# Patient Record
Sex: Male | Born: 1955 | Race: White | Hispanic: No | Marital: Married | Smoking: Former smoker
Health system: Southern US, Community
[De-identification: ages and names within clinical notes are randomized; demographics above are authoritative.]

## PROBLEM LIST (undated history)

## (undated) DIAGNOSIS — G4733 Obstructive sleep apnea (adult) (pediatric): Secondary | ICD-10-CM

## (undated) DIAGNOSIS — I729 Aneurysm of unspecified site: Secondary | ICD-10-CM

## (undated) DIAGNOSIS — M109 Gout, unspecified: Secondary | ICD-10-CM

## (undated) DIAGNOSIS — I4891 Unspecified atrial fibrillation: Secondary | ICD-10-CM

## (undated) DIAGNOSIS — I10 Essential (primary) hypertension: Secondary | ICD-10-CM

## (undated) HISTORY — PX: KNEE ARTHROSCOPY: SUR90

## (undated) HISTORY — PX: TONSILLECTOMY: SUR1361

## (undated) HISTORY — DX: Essential (primary) hypertension: I10

## (undated) HISTORY — DX: Unspecified atrial fibrillation: I48.91

## (undated) HISTORY — DX: Gout, unspecified: M10.9

## (undated) HISTORY — DX: Obstructive sleep apnea (adult) (pediatric): G47.33

---
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
## 2003-12-15 ENCOUNTER — Ambulatory Visit: Payer: Self-pay | Admitting: Unknown Physician Specialty

## 2006-01-13 ENCOUNTER — Emergency Department: Payer: Self-pay

## 2006-12-06 ENCOUNTER — Ambulatory Visit: Payer: Self-pay | Admitting: Sports Medicine

## 2007-03-25 ENCOUNTER — Ambulatory Visit: Payer: Self-pay | Admitting: Orthopaedic Surgery

## 2012-04-21 ENCOUNTER — Emergency Department: Payer: Self-pay | Admitting: Emergency Medicine

## 2014-03-29 HISTORY — PX: REPLACEMENT TOTAL KNEE: SUR1224

## 2014-03-30 DIAGNOSIS — M1710 Unilateral primary osteoarthritis, unspecified knee: Secondary | ICD-10-CM | POA: Insufficient documentation

## 2014-03-30 DIAGNOSIS — M179 Osteoarthritis of knee, unspecified: Secondary | ICD-10-CM | POA: Insufficient documentation

## 2014-03-31 ENCOUNTER — Ambulatory Visit: Payer: Self-pay | Admitting: General Practice

## 2014-03-31 LAB — URINALYSIS, COMPLETE
BLOOD: NEGATIVE
Bacteria: NONE SEEN
Bilirubin,UR: NEGATIVE
Glucose,UR: NEGATIVE mg/dL (ref 0–75)
Ketone: NEGATIVE
LEUKOCYTE ESTERASE: NEGATIVE
Nitrite: NEGATIVE
PH: 5 (ref 4.5–8.0)
PROTEIN: NEGATIVE
RBC,UR: <1 /HPF (ref 0–5)
Specific Gravity: 1.021 (ref 1.003–1.030)
Squamous Epithelial: 1
WBC UR: 2 /HPF (ref 0–5)

## 2014-03-31 LAB — PROTIME-INR
INR: 0.9
Prothrombin Time: 12.7 secs

## 2014-03-31 LAB — BASIC METABOLIC PANEL
ANION GAP: 6 — AB (ref 7–16)
BUN: 14 mg/dL (ref 7–18)
CREATININE: 1.04 mg/dL (ref 0.60–1.30)
Calcium, Total: 9.4 mg/dL (ref 8.5–10.1)
Chloride: 107 mmol/L (ref 98–107)
Co2: 29 mmol/L (ref 21–32)
Glucose: 101 mg/dL — ABNORMAL HIGH (ref 65–99)
Osmolality: 284 (ref 275–301)
Potassium: 3.8 mmol/L (ref 3.5–5.1)
Sodium: 142 mmol/L (ref 136–145)

## 2014-03-31 LAB — CBC
HCT: 44.6 % (ref 40.0–52.0)
HGB: 15.1 g/dL (ref 13.0–18.0)
MCH: 32.1 pg (ref 26.0–34.0)
MCHC: 33.8 g/dL (ref 32.0–36.0)
MCV: 95 fL (ref 80–100)
PLATELETS: 177 10*3/uL (ref 150–440)
RBC: 4.71 10*6/uL (ref 4.40–5.90)
RDW: 12.8 % (ref 11.5–14.5)
WBC: 5.8 10*3/uL (ref 3.8–10.6)

## 2014-03-31 LAB — APTT: ACTIVATED PTT: 26.8 s (ref 23.6–35.9)

## 2014-03-31 LAB — MRSA PCR SCREENING

## 2014-03-31 LAB — SEDIMENTATION RATE: Erythrocyte Sed Rate: 6 mm/hr (ref 0–20)

## 2014-04-01 LAB — URINE CULTURE

## 2014-04-07 ENCOUNTER — Ambulatory Visit: Payer: Self-pay | Admitting: Family Medicine

## 2014-04-12 ENCOUNTER — Inpatient Hospital Stay: Payer: Self-pay | Admitting: General Practice

## 2014-04-27 DIAGNOSIS — Z96659 Presence of unspecified artificial knee joint: Secondary | ICD-10-CM | POA: Insufficient documentation

## 2014-06-27 NOTE — Discharge Summary (Signed)
PATIENT NAME:  Tony Hampton, Tony Hampton MR#:  [id number] DATE OF BIRTH:  [birth year]  DATE OF ADMISSION:  04/12/2014 DATE OF DISCHARGE:  04/15/2014  ADMITTING DIAGNOSIS: Degenerative arthrosis of the right knee.   DISCHARGE DIAGNOSIS: Degenerative arthrosis of the right knee.   HISTORY: The patient is a 59 year old gentleman who has been followed at [facility] for progression of right knee pain. The patient had previously underwent a right knee arthroscopy for partial medial meniscectomy as well as chondroplasty in 2009. He did well for a number of years, but has had progressive medial and anterior right knee pain. His symptoms have been worse since he retired. He was having difficulty ascending and descending stairs. He had reported some activity-related swelling as well as near giving way of the knee. He denied any gross locking of the knee. At the time of surgery, he was not using any ambulatory aid. The patient states his right knee pain had progressed to the point that it was significantly interfering with his activities of daily living. X-rays taken at [facility] showed narrowing of the medial cartilage space with associated varus alignment. There was osteophyte as well as subchondral sclerosis noted. After discussion of the risks and benefits of surgical intervention, the patient expressed his understanding of the risks and benefits and agreed for plans for surgical intervention.   HOSPITAL COURSE:   PROCEDURE: Right total knee arthroplasty using computer-assisted navigation.   ANESTHESIA: Spinal.   SOFT TISSUE RELEASE: Anterior cruciate ligament, posterior cruciate ligament, deep and superficial medial collateral ligaments, as well as patellofemoral ligament.   IMPLANTS UTILIZED:  DePuy PFC Sigma size 5 posterior stabilized femoral component (cemented), size 5 MBT tibial component (cemented), 41 mm 3-pegged oval dome patella (cemented), and a 10 mm stabilized rotating platform  polyethylene insert.   The patient tolerated the procedure very well. He had no complications. He was then taken to the PAC-U where he was stabilized and then transferred to the orthopedic floor. The patient began receiving anticoagulation therapy of Lovenox 40 mg subcutaneous every 12 hours per anesthesia and pharmacy protocol. He was fitted with TED stockings bilaterally. These were allowed to be removed 1 hour per 8 hour shift. The right one was applied on day 2 following removal of the Hemovac. The patient was also fitted with the AV-I compression foot pumps bilaterally set at 80 mmHg. His calves have been nontender. There has been no evidence of any DVTs. Negative Homans sign. Heels were elevated off the bed using rolled towels.   The patient has denied any chest pain or shortness of breath. Vital signs have been stable. He has been afebrile. Hemodynamically he is stable. No transfusions were given other than the Autovac transfusion given the first 6 hours postoperatively.   Physical therapy was initiated on day 1 for gait training and transfers. Upon being discharged, was ambulating greater than 200 feet. He was able go up 4 steps and down. He was independent with bed to chair transfers. Occupational therapy was also initiated on day 1 for activities of daily living and assistive devices.   The patient is being discharged to his home in improved stable condition.   DISCHARGE INSTRUCTIONS: He can continue full weight-bearing as tolerated. Continue using a walker until cleared by physical therapy to go to a quad cane. He will receive home health/PT.  He was instructed in elevation of the lower extremity. Continue TED stockings. These are to be worn bilaterally and may be removed at night but are  to be worn during the day. Incentive spirometer q. 1 hour while awake. Encourage cough, deep breathing q. 2 hours while awake. He is placed on a regular diet. He is to continue with Polar Care to the surgical  leg, maintaining a temperature of 40-50 degrees Fahrenheit. He was instructed on wound care. He has a follow-up appointment on March 1st at 8:45. He is to call us sooner if any temperatures of 101.5 or greater or excessive bleeding.   DISCHARGE MEDICATIONS: The patient may resume his regular medications that he was on prior to admission. He was given a prescription for Lovenox 40 mg subcutaneously daily for 14 days, then discontinue and begin taking 81 mg enteric-coated aspirin. Also a prescription for Roxicodone 5-10 mg every 4-6 hours p.r.n. for pain and Tramadol 50-100 mg q. 4-6 hours p.r.n. for pain.   PAST MEDICAL HISTORY: Atrial fibrillation, asthma, gout, hemorrhoids, and hypertension.   ___________________________ Vance Peper, PA jrw:mc D: 04/15/2014 07:47:04 ET T: 04/15/2014 08:08:00 ET JOB#: [id number]  cc: Vance Peper, PA, <Dictator> JON WOLFE PA ELECTRONICALLY SIGNED 04/18/2014 19:49

## 2014-06-27 NOTE — Discharge Summary (Signed)
PATIENT NAME:  Tony Hampton, CIMO MR#:  [id number] DATE OF BIRTH:  02/24/1956  DATE OF ADMISSION:  04/12/2014 DATE OF DISCHARGE:    ADDENDUM:   The patient was initially to be discharged on 04/15/2014. The patient was held based on the fact that he could not ambulate because of bilateral toe pain. The patient has a history of gout and was given colchicine for his Gout flare up. The patient did well although he was having some difficulty the day of initial discharge and then on 04/16/2014 was able to ambulate around the nurse's station, as well as to do stairs. Instead of going to rehab was ready to go home with home health physical therapy and good family support.    ____________________________ Lenna Sciara. Reche Dixon, [place] jtm:at D: 04/16/2014 06:17:47 ET T: 04/16/2014 09:55:19 ET JOB#: [id number]  cc: J. Reche Dixon, [place], <Dictator> J Jese Comella [facility] PA ELECTRONICALLY SIGNED 04/18/2014 7:11

## 2014-06-27 NOTE — Op Note (Signed)
PATIENT NAME:  Tony Hampton, Tony Hampton MR#:  [id number] DATE OF BIRTH:  29-Aug-1955  DATE OF PROCEDURE:  04/12/2014  PREOPERATIVE DIAGNOSIS: Degenerative arthrosis of right knee (primary).   POSTOPERATIVE DIAGNOSIS: Degenerative arthrosis of the right knee (primary).   PROCEDURE PERFORMED: Right total knee arthroplasty using computer-assisted navigation.   SURGEON: Skip Estimable, M.D.   ASSISTANT: Vance Peper, PA (required to maintain retraction throughout the procedure).   ANESTHESIA: Spinal.   ESTIMATED BLOOD LOSS: 100 mL.   FLUIDS REPLACED: 1300 mL of crystalloid.   TOURNIQUET TIME: 90 minutes.    DRAINS: Two medium drains to reinfusion system.   SOFT TISSUE RELEASES: Anterior cruciate ligament, posterior cruciate ligament, deep and superficial medial collateral ligament and patellofemoral ligament.   IMPLANTS UTILIZED:   DePuy PFC Sigma size 5 posterior stabilized femoral component (cemented), size 5 MBT tibial component (cemented), 41 mm 3 peg oval dome patella (cemented), and a 10 mm stabilized rotating platform polyethylene insert.   INDICATIONS FOR SURGERY:  The patient is a 59 year old male who has been seen for complaints of progressive right knee pain.  X-rays demonstrated severe degenerative changes in tri-compartmental fashion with relative varus deformity. After discussion of the risks and benefits of surgical intervention, the patient expressed understanding of the risks and benefits, and agreed with plans for surgical intervention.   PROCEDURE IN DETAIL: The patient was brought to the Operating Room, and after adequate spinal anesthesia was achieved, a tourniquet was placed on the patient's upper right thigh. The patient's right knee and leg were cleaned and prepped with alcohol and DuraPrep and draped in the usual sterile fashion. A "timeout" was performed as per usual protocol. The right lower extremity was exsanguinated using an Esmarch, a tourniquet was inflated to 300 mmHg.  An  anterior longitudinal incision was made followed by a standard mid vastus approach.  A small effusion was evacuated. The deep fibers of the medial collateral ligament were elevated in a subperiosteal fashion off the medial flare of the tibia so as to maintain a continuous soft tissue sleeve. The patella was subluxed laterally and the patellofemoral ligament was incised. Inspection of the knee demonstrated severe degenerative changes with full-thickness loss of articular cartilage to the medial compartment.  Prominent osteophytes were debrided using a rongeur. Anterior and posterior cruciate ligaments were excised. Two 4.0 mm Schanz pins were inserted into the femur and into the tibia for attachment of the array of trackers used for computer-assisted navigation.  Hip center was identified using circumduction technique.  Distal landmarks were mapped using the computer. The distal femur and proximal tibia were mapped using the computer. Distal femoral cutting guide was positioned using computer-assisted navigation so as to achieve a 5 degree distal valgus cut. Cut was performed and verified using the computer.  Distal femur was sized and it was felt that a size 5 femoral component was appropriate. A size five cutting guide was positioned and anterior cut was performed, verified by using the computer.  This was followed by completion of the posterior and chamfer cuts.  Femoral cutting guide for the central box was then positioned, central box cut was performed. Attention was then directed to the proximal tibia. Medial and lateral menisci were excised. The extramedullary tibial cutting guide was positioned using computer-assisted navigation so as to achieve a 0 degrees varus valgus alignment and 0 degrees posterior slope.  Cut was performed and verified using the computer. The proximal tibia was sized and it was felt that a size 5 tibial tray  was appropriate. Tibial and femoral trials were inserted followed by insertion  of a 10 mm polyethylene insert. The knee was felt to be tight medially.  A Cobb elevator was used to elevate the superficial fibers of the medial collateral ligament.  This allowed for excellent mediolateral soft tissue balancing both with full extension and in flexion.  Finally, the patella was cut and prepared so as to accommodate a 41 mm 3-peg oval dome patella. Patellar trial was placed and the knee was placed through a range of motion with excellent patellar tracking appreciated. The femoral trial was removed after debridement of posterior osteophytes. Central post hole for the tibial component was reamed followed by insertion of a keel punch. Tibial trial was then removed. The cut surfaces of bone were irrigated with copious amounts of normal saline with antibiotic solution using pulsatile lavage then suctioned dry. Polymethyl methacrylate cement was prepared in the usual fashion using a vacuum mixer.  Cement was applied to the cut surface of the proximal tibia as well as along the undersurface of a size 5 MBT tibial component. Tibial component was positioned and impacted into place. Excess cement was removed using freer elevators. Cement was then applied to the cut surface of the femur as well as along the posterior flanges of a size 5 posterior stabilized femoral component. Femoral component was positioned and impacted into place. Excess cement was removed using freer elevators. A 10 mm polyethylene trial was inserted and the knee was brought in full extension with steady axial compression applied. Finally, cement was applied to the backside of a 41 mm 3-peg oval dome patella, and patellar component was positioned and patellar clamp applied.  Excess cement was removed using freer elevators.   After adequate curing of cement, the tourniquet was deflated after total tourniquet time of 90 minutes.  Hemostasis was achieved using electrocautery.  The knee was irrigated with copious amounts of normal saline with  antibiotic solution using pulsatile lavage and then suctioned dry. The knee was inspected for any residual cement debris.  20 mL of 1.3% Exparel and 40 mL of normal saline was injected along the posterior capsule, medial and lateral gutters, and along the arthrotomy site. A 10 mm stabilized rotating platform polyethylene insert was inserted and the knee was placed through a range of motion with excellent mediolateral soft tissue balancing and excellent patellar tracking noted. Two medium drains were placed in the wound bed and brought out through a separate stab incision to be attached to a reinfusion system. The medial parapatellar portion of the incision was reapproximated using interrupted sutures of #1 Vicryl.  The subcutaneous tissue was approximated in layers using first #0 Vicryl followed by 2-0 Vicryl. Then, 30 mL of 0.25% Marcaine with epinephrine was injected into the subcutaneous tissue in line with the skin incision.  Skin was closed with skin staples.  A sterile dressing was applied. The patient tolerated the procedure well. He was transported to the recovery room in stable condition.      ____________________________ Laurice Record. Holley Bouche., MD jph:DT D: 04/12/2014 11:02:58 ET T: 04/12/2014 14:08:33 ET JOB#: [id number]  cc: Laurice Record. Holley Bouche., MD, <Dictator> Katherine P Holley Bouche MD ELECTRONICALLY SIGNED 04/12/2014 20:21

## 2014-08-12 ENCOUNTER — Other Ambulatory Visit: Payer: Self-pay | Admitting: Family Medicine

## 2014-12-13 ENCOUNTER — Other Ambulatory Visit: Payer: Self-pay

## 2014-12-13 DIAGNOSIS — E669 Obesity, unspecified: Secondary | ICD-10-CM | POA: Insufficient documentation

## 2014-12-13 DIAGNOSIS — G44019 Episodic cluster headache, not intractable: Secondary | ICD-10-CM | POA: Insufficient documentation

## 2014-12-13 DIAGNOSIS — I4891 Unspecified atrial fibrillation: Secondary | ICD-10-CM | POA: Insufficient documentation

## 2014-12-13 DIAGNOSIS — Z8269 Family history of other diseases of the musculoskeletal system and connective tissue: Secondary | ICD-10-CM | POA: Insufficient documentation

## 2014-12-13 DIAGNOSIS — I10 Essential (primary) hypertension: Secondary | ICD-10-CM | POA: Insufficient documentation

## 2014-12-14 ENCOUNTER — Ambulatory Visit (INDEPENDENT_AMBULATORY_CARE_PROVIDER_SITE_OTHER): Payer: BLUE CROSS/BLUE SHIELD | Admitting: Family Medicine

## 2014-12-14 ENCOUNTER — Encounter: Payer: Self-pay | Admitting: Family Medicine

## 2014-12-14 VITALS — BP 132/98 | HR 79 | Temp 97.9°F | Resp 16 | Wt 340.4 lb

## 2014-12-14 DIAGNOSIS — J069 Acute upper respiratory infection, unspecified: Secondary | ICD-10-CM | POA: Diagnosis not present

## 2014-12-14 DIAGNOSIS — R350 Frequency of micturition: Secondary | ICD-10-CM | POA: Diagnosis not present

## 2014-12-14 LAB — POCT URINALYSIS DIPSTICK
Bilirubin, UA: NEGATIVE
Blood, UA: NEGATIVE
GLUCOSE UA: NEGATIVE
Ketones, UA: NEGATIVE
LEUKOCYTES UA: NEGATIVE
Nitrite, UA: NEGATIVE
Protein, UA: NEGATIVE
UROBILINOGEN UA: 0.2
pH, UA: 6

## 2014-12-14 MED ORDER — ALBUTEROL SULFATE HFA 108 (90 BASE) MCG/ACT IN AERS: 2.0000 | INHALATION_SPRAY | Freq: Four times a day (QID) | RESPIRATORY_TRACT | Status: DC | PRN

## 2014-12-14 MED ORDER — PROMETHAZINE-DM 6.25-15 MG/5ML PO SYRP: 5.0000 mL | ORAL_SOLUTION | Freq: Four times a day (QID) | ORAL | Status: DC | PRN

## 2014-12-14 NOTE — Progress Notes (Signed)
Patient ID: KEYONTE COOKSTON, male   DOB: 01-04-1956, 59 y.o.   MRN: [id number] Name: DEVARIS QUIRK   MRN: [id number]    DOB: [birth year]   Date:12/14/2014       Progress Note  Subjective  Chief Complaint  Chief Complaint  Patient presents with  . URI    URI  This is a new problem. The current episode started in the past 7 days. The problem has been gradually worsening. There has been no fever. Associated symptoms include congestion and coughing. Pertinent negatives include no ear pain, headaches, joint pain, rhinorrhea, sinus pain, sneezing or sore throat. He has tried antihistamine for the symptoms.   Urinary Frequency Onset over the past year. No hematuria or dysuria. Get up 3 times at night on average and disturbs sleep with urgency. Also, having decrease in erectile strength and decrease in urine stream strength.  Patient Active Problem List   Diagnosis Date Noted  . Atrial fibrillation ([place]) 12/13/2014  . Cluster headache, episodic 12/13/2014  . Family history of gout 12/13/2014  . Essential (primary) hypertension 12/13/2014  . Obesity 12/13/2014  . H/O total knee replacement 04/27/2014  . Arthritis of knee, degenerative 03/30/2014  . Compulsive tobacco user syndrome 02/27/1968   No past surgical history on file.   Family History  Problem Relation Age of Onset  . Asthma Mother   . Hypertension Mother   . Hypertension Father   . Diabetes Father   . Breast cancer Sister    Social History  Substance Use Topics  . Smoking status: Never Smoker   . Smokeless tobacco: Never Used  . Alcohol Use: No    Current outpatient prescriptions:  .  amLODipine (NORVASC) 5 MG tablet, Take by mouth., Disp: , Rfl:  .  Aspirin 81 MG EC tablet, Take by mouth., Disp: , Rfl:  .  Colchicine 0.6 MG CAPS, Take by mouth., Disp: , Rfl:  .  metoprolol succinate (TOPROL-XL) 50 MG 24 hr tablet, TAKE 1 AND 1/2 TABLET BY MOUTH EVERY DAY, Disp: 135 tablet, Rfl: 1  No Known Allergies  Review of  Systems  Constitutional: Negative.   HENT: Positive for congestion. Negative for ear pain, rhinorrhea, sneezing and sore throat.   Eyes: Negative.   Respiratory: Positive for cough and shortness of breath.   Cardiovascular: Negative.   Gastrointestinal: Negative.   Genitourinary: Negative.   Musculoskeletal: Negative.  Negative for joint pain.  Skin: Negative.   Neurological: Negative.  Negative for headaches.  Endo/Heme/Allergies: Negative.   Psychiatric/Behavioral: Negative.    Objective  Filed Vitals:   12/14/14 0808  BP: 132/98  Pulse: 79  Temp: 97.9 F (36.6 C)  TempSrc: Oral  Resp: 16  Weight: 340 lb 6.4 oz (154.404 kg)  SpO2: 94%   Physical Exam  Constitutional: He is oriented to person, place, and time and well-developed, well-nourished, and in no distress.  Morbid obesity  HENT:  Head: Normocephalic and atraumatic.  Right Ear: External ear normal.  Left Ear: External ear normal.  Nose: Nose normal.  Mouth/Throat: Oropharynx is clear and moist.  Eyes: Conjunctivae and EOM are normal.  Neck: Normal range of motion. Neck supple.  Cardiovascular: Normal rate, regular rhythm and normal heart sounds.   Pulmonary/Chest: Effort normal and breath sounds normal.  Abdominal: Soft. Bowel sounds are normal.  Musculoskeletal: Normal range of motion.  Lymphadenopathy:    He has no cervical adenopathy.  Neurological: He is alert and oriented to person, place, and time.  Psychiatric: Affect  and judgment normal.   Assessment & Plan  1. Upper respiratory infection Onset over the past week with cough worse with wheezing at night. No fever, rales or rhonchi. Will treat with cough syrup and albuterol inhaler. Recheck if any worsening or fever in the next week. - promethazine-dextromethorphan (PROMETHAZINE-DM) 6.25-15 MG/5ML syrup; Take 5 mLs by mouth 4 (four) times daily as needed for cough.  Dispense: 118 mL; Refill: 0 - albuterol (PROVENTIL HFA;VENTOLIN HFA) 108 (90 BASE)  MCG/ACT inhaler; Inhale 2 puffs into the lungs every 6 (six) hours as needed for wheezing or shortness of breath.  Dispense: 1 Inhaler; Refill: 0  2. Frequent urination Onset over the past year. No hematuria or dysuria. Urinalysis normal. Suspect BPH with nocturia and decreased stream. Recommend scheduling for CPE and blood tests. No sign of infection today. Family history positive for diabetes. No polydipsia or blurring of vision. - POCT urinalysis dipstick

## 2014-12-21 ENCOUNTER — Ambulatory Visit (INDEPENDENT_AMBULATORY_CARE_PROVIDER_SITE_OTHER): Payer: BLUE CROSS/BLUE SHIELD | Admitting: Family Medicine

## 2014-12-21 ENCOUNTER — Encounter: Payer: Self-pay | Admitting: Family Medicine

## 2014-12-21 VITALS — BP 130/88 | HR 71 | Temp 97.9°F | Resp 18 | Ht 75.0 in | Wt 341.0 lb

## 2014-12-21 DIAGNOSIS — Z96651 Presence of right artificial knee joint: Secondary | ICD-10-CM | POA: Diagnosis not present

## 2014-12-21 DIAGNOSIS — I4891 Unspecified atrial fibrillation: Secondary | ICD-10-CM | POA: Diagnosis not present

## 2014-12-21 DIAGNOSIS — R351 Nocturia: Secondary | ICD-10-CM

## 2014-12-21 DIAGNOSIS — Z23 Encounter for immunization: Secondary | ICD-10-CM

## 2014-12-21 DIAGNOSIS — Z0000 Encounter for general adult medical examination without abnormal findings: Secondary | ICD-10-CM

## 2014-12-21 DIAGNOSIS — I10 Essential (primary) hypertension: Secondary | ICD-10-CM | POA: Diagnosis not present

## 2014-12-21 DIAGNOSIS — E669 Obesity, unspecified: Secondary | ICD-10-CM

## 2014-12-21 NOTE — Progress Notes (Signed)
Patient ID: Tony Hampton, male   DOB: 1956/01/12, 59 y.o.   MRN: [id number]       Patient: Tony Hampton, Male    DOB: September 24, 1955, 59 y.o.   MRN: [id number] Visit Date: 12/21/2014  Today's Provider: Vernie Murders, PA   Chief Complaint  Patient presents with  . Annual Exam   Subjective:    Annual physical exam Tony Hampton is a 59 y.o. male who presents today for health maintenance and complete physical. He feels well. He reports exercising none. He reports he is sleeping well (average 6-7 hours a night).  -----------------------------------------------------------------   Review of Systems  Constitutional: Negative.   HENT: Negative.   Eyes: Negative.   Respiratory: Positive for shortness of breath.   Cardiovascular: Negative.   Gastrointestinal: Negative.   Endocrine: Negative.   Genitourinary: Negative.        Nocturia once a night with some dribbling after he thought he was finished urinating.  Musculoskeletal: Negative.   Skin: Negative.   Allergic/Immunologic: Negative.   Neurological: Negative.   Hematological: Negative.   Psychiatric/Behavioral: Negative.     Social History He  reports that he has quit smoking. He has never used smokeless tobacco. He reports that he drinks alcohol. He reports that he does not use illicit drugs. Social History   Social History  . Marital Status: Married    Spouse Name: N/A  . Number of Children: N/A  . Years of Education: N/A   Social History Main Topics  . Smoking status: Former Research scientist (life sciences)  . Smokeless tobacco: Never Used  . Alcohol Use: 0.0 oz/week    0 Standard drinks or equivalent per week     Comment: OCCASIONALLY  . Drug Use: No  . Sexual Activity: Not Asked   Other Topics Concern  . None   Social History Narrative    Patient Active Problem List   Diagnosis Date Noted  . Atrial fibrillation ([place]) 12/13/2014  . Cluster headache, episodic 12/13/2014  . Family history of gout 12/13/2014  . Essential  (primary) hypertension 12/13/2014  . Obesity 12/13/2014  . H/O total knee replacement 04/27/2014  . Arthritis of knee, degenerative 03/30/2014  . Compulsive tobacco user syndrome 02/27/1968    Past Surgical History  Procedure Laterality Date  . Tonsillectomy    . Knee arthroscopy Right     Family History  No family status information on file.   His family history includes Asthma in his mother; Breast cancer in his sister; Diabetes in his father; Hypertension in his father and mother.    No Known Allergies  Previous Medications   ALBUTEROL (PROVENTIL HFA;VENTOLIN HFA) 108 (90 BASE) MCG/ACT INHALER    Inhale 2 puffs into the lungs every 6 (six) hours as needed for wheezing or shortness of breath.   AMLODIPINE (NORVASC) 5 MG TABLET    Take by mouth.   ASPIRIN 81 MG EC TABLET    Take by mouth.   COLCHICINE 0.6 MG CAPS    Take by mouth.   METOPROLOL SUCCINATE (TOPROL-XL) 50 MG 24 HR TABLET    TAKE 1 AND 1/2 TABLET BY MOUTH EVERY DAY   PROMETHAZINE-DEXTROMETHORPHAN (PROMETHAZINE-DM) 6.25-15 MG/5ML SYRUP    Take 5 mLs by mouth 4 (four) times daily as needed for cough.    Patient Care Team: Jerrol Banana., MD as PCP - General (Family Medicine)     Objective:   Vitals: BP 130/88 mmHg  Pulse 71  Temp(Src) 97.9 F (36.6 C) (Oral)  Resp 18  Ht 6\' 3"  (1.905 m)  Wt 341 lb (154.677 kg)  BMI 42.62 kg/m2  SpO2 97%   Physical Exam  Constitutional: He is oriented to person, place, and time. He appears well-developed and well-nourished.  Morbid obesity  HENT:  Head: Normocephalic and atraumatic.  Right Ear: External ear normal.  Left Ear: External ear normal.  Nose: Nose normal.  Mouth/Throat: Oropharynx is clear and moist.  Eyes: Conjunctivae and EOM are normal. Pupils are equal, round, and reactive to light. Right eye exhibits no discharge.  Neck: Normal range of motion. Neck supple. No tracheal deviation present. No thyromegaly present.  Cardiovascular: Normal rate,  regular rhythm, normal heart sounds and intact distal pulses.   No murmur heard. Pulmonary/Chest: Effort normal and breath sounds normal. No respiratory distress. He has no wheezes. He has no rales. He exhibits no tenderness.  Abdominal: Soft. He exhibits no distension and no mass. There is no tenderness. There is no rebound and no guarding.  Musculoskeletal: Normal range of motion. He exhibits no edema or tenderness.  Few superficial varicose veins in right calf with history of right knee replacement.  Lymphadenopathy:    He has no cervical adenopathy.  Neurological: He is alert and oriented to person, place, and time. He has normal reflexes. No cranial nerve deficit. He exhibits normal muscle tone. Coordination normal.  Skin: Skin is warm and dry. No rash noted. No erythema.  Psychiatric: He has a normal mood and affect. His behavior is normal. Judgment and thought content normal.   Depression Screen No suicidal ideation. Sleeping well without mood swings. No anxiety or panic attacks.  Assessment & Plan:     Routine Health Maintenance and Physical Exam  Exercise Activities and Dietary recommendations Goals    Encouraged to work on weight loss with low fat diet and reduced calorie intake. Should exercise 20-30 minutes 3-4 times a week as joint replacement allows.      There is no immunization history on file for this patient.  Health Maintenance  Topic Date Due  . Hepatitis C Screening  06-05-1955  . HIV Screening  12/28/1971  . TETANUS/TDAP  12/28/1975  . COLONOSCOPY  12/28/2006  . INFLUENZA VACCINE  09/27/2014   Discussed health benefits of physical activity, and encouraged him to engage in regular exercise appropriate for his age and condition.    --------------------------------------------------------------------  1. Annual physical exam Stable general health. Unsure when he had his last Tdap and declines other vaccinations today. Last colonoscopy approximately in 2009  per patient.  2. Atrial fibrillation, unspecified type ([place]) Asymptomatic. History of A. Fib with PVC's controlled by Metoprolol. EKG today raises the question of lateral infarct of undetermined age. Recommend he follow up with Dr. Saralyn Pilar. Patient agrees to schedule his annual recheck soon. - EKG 12-Lead  3. Essential (primary) hypertension Stable and well controlled with Amlodipine and Metoprolol. Will get routine labs and follow up pending reports. - TSH - Lipid panel  4. Obesity BMI over 42. Will get routine labs and encouraged to lower caloric intake and exercise as the knee replacement allows. - Comprehensive metabolic panel - CBC with Differential/Platelet  5. H/O total knee replacement, right Joint replaced February 2016 by Dr. Marry Guan. Well healed and feeling improved.  6. Nocturia Only nocturia 1-2 times a night. No hesitancy, decreased stream or dribbling. - PSA  7. Need for Tdap vaccination - Tdap vaccine greater than or equal to 7yo IM

## 2014-12-21 NOTE — Patient Instructions (Signed)

## 2014-12-25 ENCOUNTER — Other Ambulatory Visit: Payer: Self-pay | Admitting: Family Medicine

## 2015-01-19 ENCOUNTER — Encounter: Payer: Self-pay | Admitting: Family Medicine

## 2015-04-05 ENCOUNTER — Other Ambulatory Visit: Payer: Self-pay | Admitting: Family Medicine

## 2015-07-06 ENCOUNTER — Other Ambulatory Visit: Payer: Self-pay | Admitting: Family Medicine

## 2015-07-30 ENCOUNTER — Other Ambulatory Visit: Payer: Self-pay | Admitting: Family Medicine

## 2015-10-02 IMAGING — CR DG KNEE 1-2V*R*
1 series · 2 of 2 positions shown · non-contrast
Comparison: None.

CLINICAL DATA: Status post knee arthroplasty

EXAM:
RIGHT KNEE - 1-2 VIEW

[Series 1: ap · 0.17mm/px · 2 of 2 slices shown]
[im 1/2]
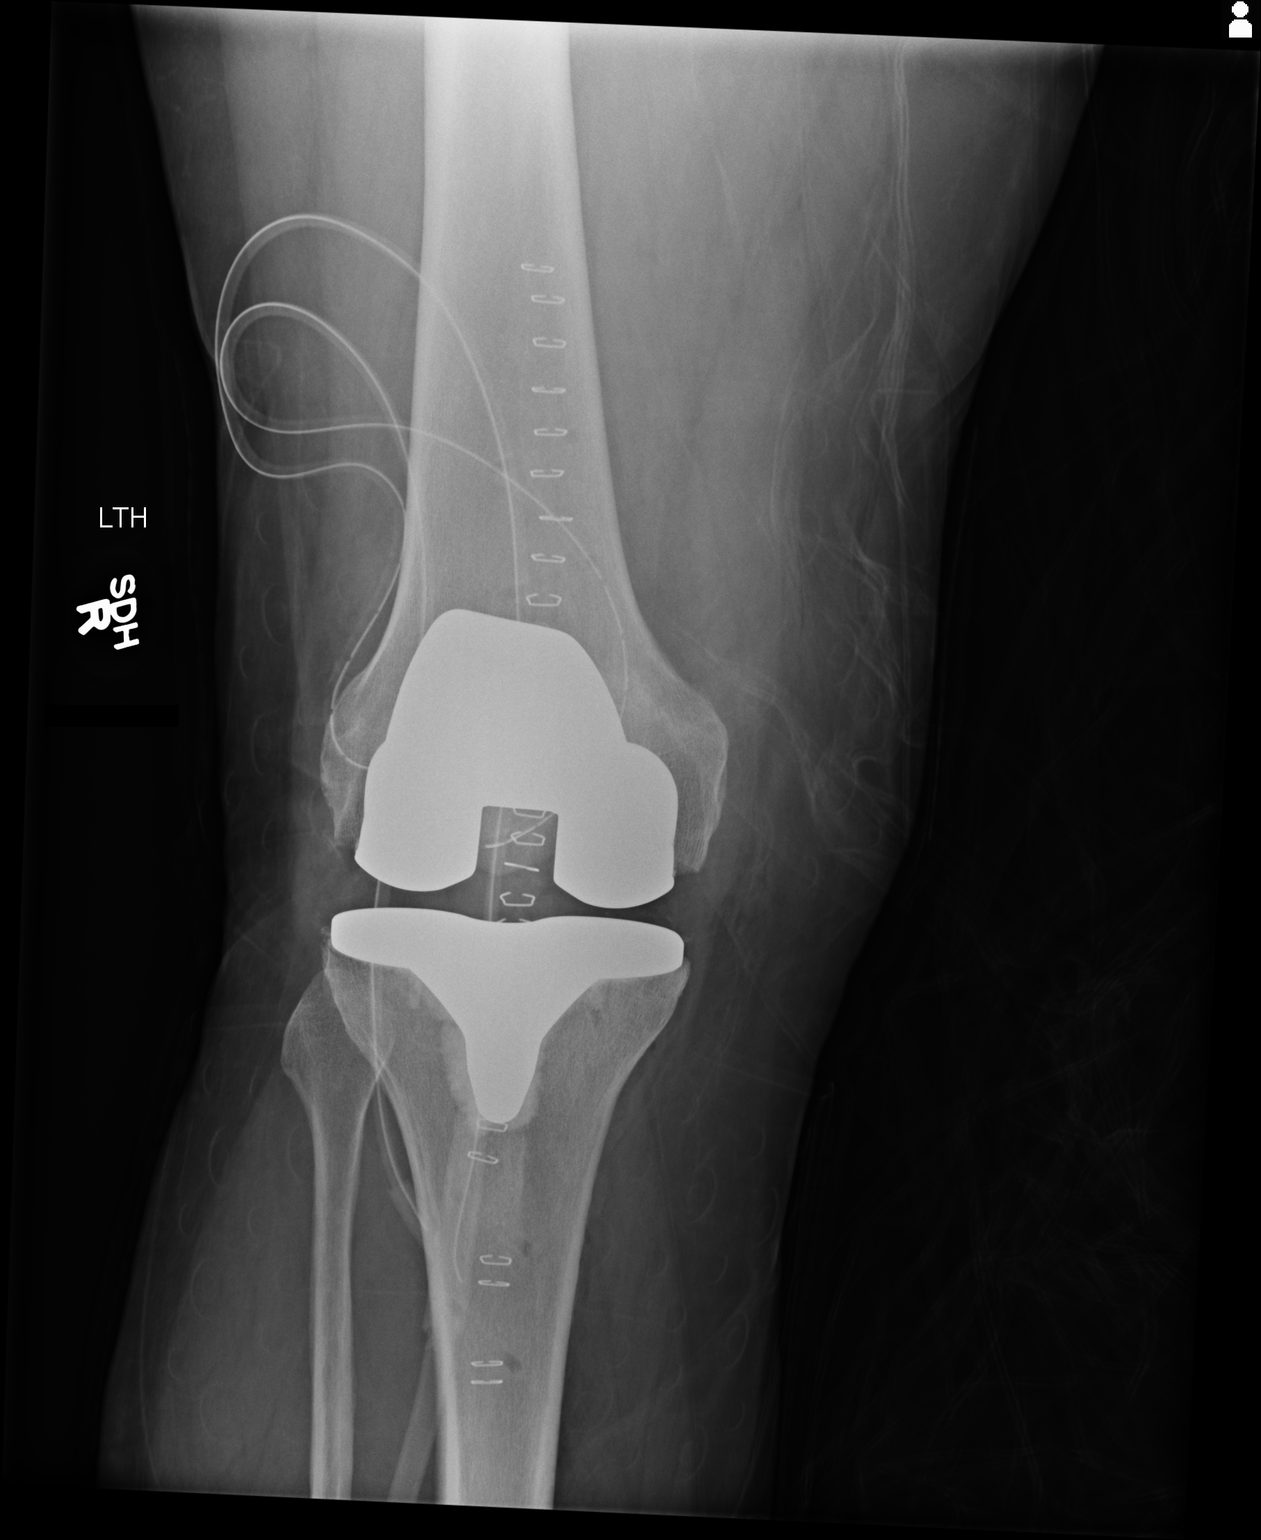
[im 2/2]
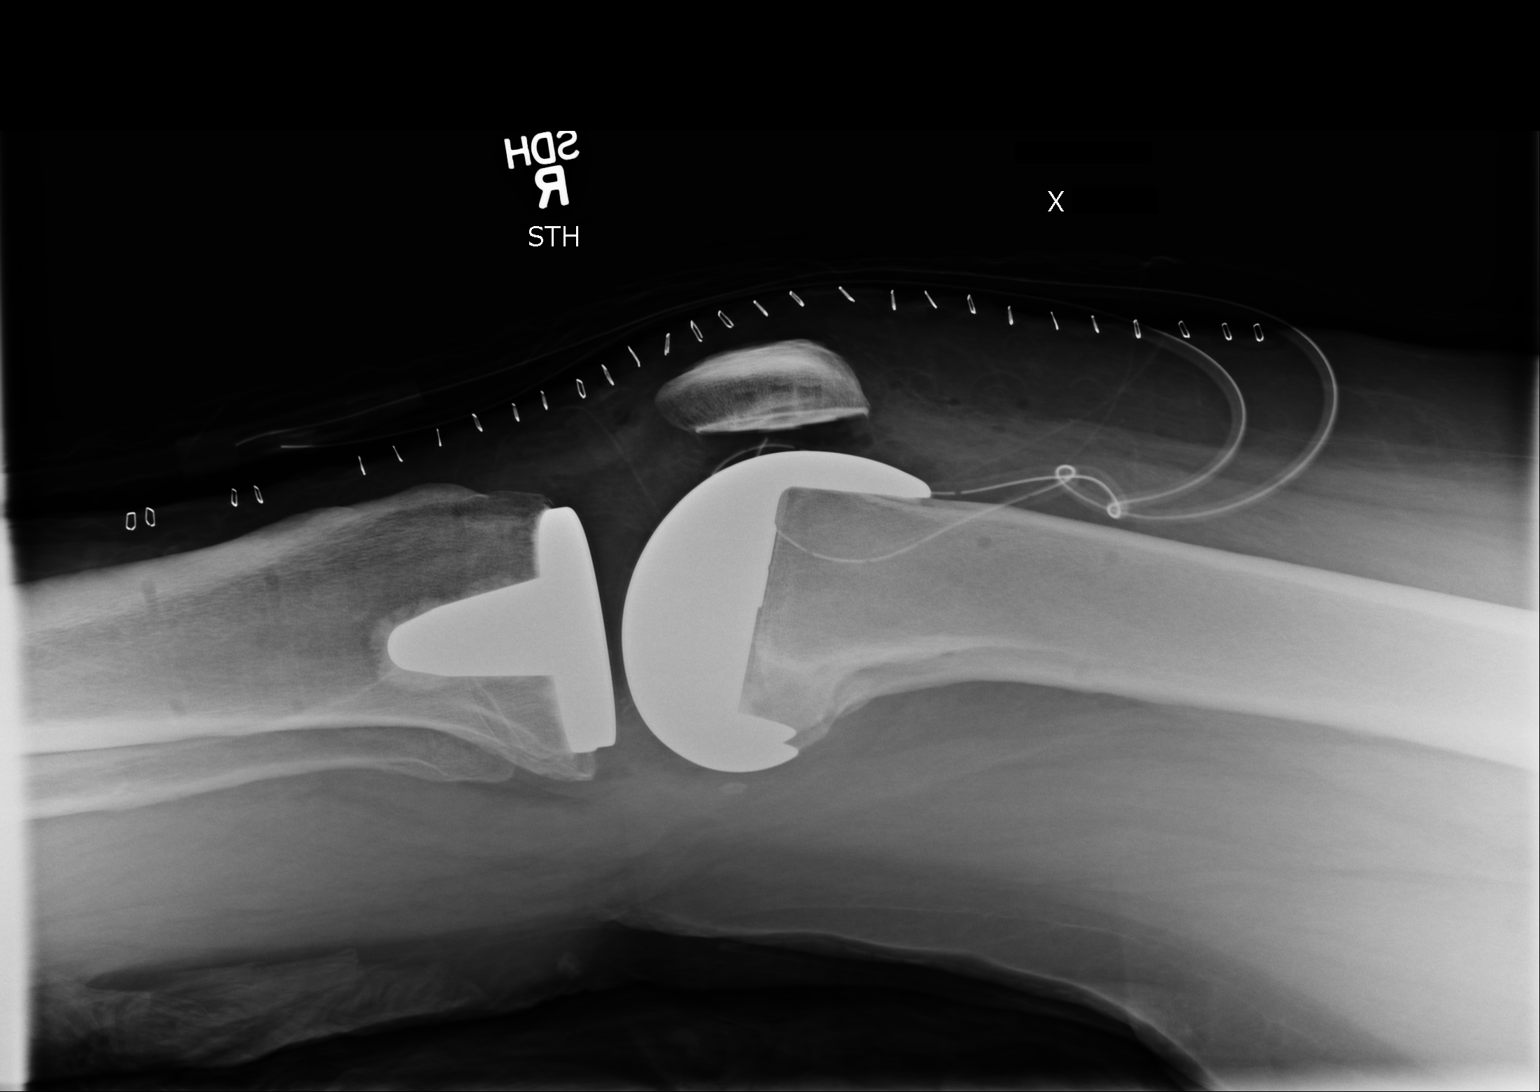

[2 of 2 positions shown; findings below may reference images not displayed]

FINDINGS: Frontal and lateral views were obtained. Patient is status post
total knee replacement with femoral and tibial prosthetic components
appearing well-seated. No fracture or dislocation. There is a drain
in the suprapatellar region.
IMPRESSION: Prosthetic components appearing well-seated. No fracture or
dislocation.

## 2015-10-03 ENCOUNTER — Other Ambulatory Visit: Payer: Self-pay | Admitting: Family Medicine

## 2015-12-30 ENCOUNTER — Encounter: Payer: Self-pay | Admitting: Family Medicine

## 2015-12-30 ENCOUNTER — Ambulatory Visit (INDEPENDENT_AMBULATORY_CARE_PROVIDER_SITE_OTHER): Payer: BLUE CROSS/BLUE SHIELD | Admitting: Family Medicine

## 2015-12-30 VITALS — BP 130/88 | HR 78 | Temp 98.2°F | Resp 18 | Wt 341.4 lb

## 2015-12-30 DIAGNOSIS — M1A0790 Idiopathic chronic gout, unspecified ankle and foot, without tophus (tophi): Secondary | ICD-10-CM

## 2015-12-30 DIAGNOSIS — I10 Essential (primary) hypertension: Secondary | ICD-10-CM

## 2015-12-30 DIAGNOSIS — L03115 Cellulitis of right lower limb: Secondary | ICD-10-CM | POA: Diagnosis not present

## 2015-12-30 DIAGNOSIS — M109 Gout, unspecified: Secondary | ICD-10-CM | POA: Insufficient documentation

## 2015-12-30 DIAGNOSIS — R6882 Decreased libido: Secondary | ICD-10-CM

## 2015-12-30 MED ORDER — CEPHALEXIN 500 MG PO TABS: 500.0000 mg | ORAL_TABLET | Freq: Four times a day (QID) | ORAL | 0 refills | Status: DC

## 2015-12-30 NOTE — Progress Notes (Signed)
Patient: Tony Hampton Male    DOB: December 05, 1955   60 y.o.   MRN: [id number] Visit Date: 12/30/2015  Today's Provider: Vernie Murders, PA   Chief Complaint  Patient presents with  . Foot Pain    and swelling of right foot    Subjective:    HPI Pt is here today for right foot pain and swelling. He reports that he thinks he was bit by something. He has a small red spot on the outer side of is affected foot. He tried his gout medication because that's what he thought it was at first. It usually helps but did not help this time. He also does not feel like he is tired all the time. He feels like it started when he started amlodipine about a year ago. He also had gained weight and is not eating that much.   Patient Active Problem List   Diagnosis Date Noted  . Atrial fibrillation ([place]) 12/13/2014  . Cluster headache, episodic 12/13/2014  . Family history of gout 12/13/2014  . Essential (primary) hypertension 12/13/2014  . Obesity 12/13/2014  . H/O total knee replacement 04/27/2014  . Arthritis of knee, degenerative 03/30/2014  . Compulsive tobacco user syndrome 02/27/1968   Past Surgical History:  Procedure Laterality Date  . KNEE ARTHROSCOPY Right   . 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 Marry Guan  . TONSILLECTOMY     Family History  Problem Relation Age of Onset  . Asthma Mother   . Hypertension Mother   . Hypertension Father   . Diabetes Father   . Breast cancer Sister    No Known Allergies  Current Outpatient Prescriptions:  .  albuterol (PROVENTIL HFA;VENTOLIN HFA) 108 (90 BASE) MCG/ACT inhaler, Inhale 2 puffs into the lungs every 6 (six) hours as needed for wheezing or shortness of breath., Disp: 1 Inhaler, Rfl: 0 .  amLODipine (NORVASC) 5 MG tablet, TAKE 1 TABLET (5 MG TOTAL) BY MOUTH DAILY., Disp: 30 tablet, Rfl: 6 .  Aspirin 81 MG EC tablet, Take by mouth., Disp: , Rfl:  .  colchicine 0.6 MG tablet, TAKE 1 TABLET BY MOUTH EVERY 2 HOURS UNTIL GI UPSET,  Disp: 100 tablet, Rfl: 2 .  metoprolol succinate (TOPROL-XL) 50 MG 24 hr tablet, TAKE 1 AND 1/2 TABLET BY MOUTH EVERY DAY, Disp: 135 tablet, Rfl: 0 .  promethazine-dextromethorphan (PROMETHAZINE-DM) 6.25-15 MG/5ML syrup, Take 5 mLs by mouth 4 (four) times daily as needed for cough. (Patient not taking: Reported on 12/21/2014), Disp: 118 mL, Rfl: 0 .  XARELTO 20 MG TABS tablet, , Disp: , Rfl:   Review of Systems  Constitutional: Positive for fatigue.  HENT: Negative.   Respiratory: Negative.   Cardiovascular: Negative.   Gastrointestinal: Negative.   Endocrine: Negative.   Genitourinary: Negative.   Musculoskeletal:       Pain and swelling in right foot  Skin: Positive for color change.  Allergic/Immunologic: Negative.   Neurological: Negative.   Hematological: Negative.   Psychiatric/Behavioral: Negative.     Social History  Substance Use Topics  . Smoking status: Former Research scientist (life sciences)  . Smokeless tobacco: Never Used  . Alcohol use 0.0 oz/week     Comment: OCCASIONALLY   Objective:   BP 130/88 (BP Location: Right Arm, Patient Position: Sitting, Cuff Size: Large)   Pulse 78   Temp 98.2 F (36.8 C) (Oral)   Resp 18   Wt (!) 341 lb 6.4 oz (154.9 kg)   BMI 42.67  kg/m   Physical Exam  Constitutional: He is oriented to person, place, and time. He appears well-developed and well-nourished. No distress.  HENT:  Head: Normocephalic and atraumatic.  Right Ear: Hearing normal.  Left Ear: Hearing normal.  Nose: Nose normal.  Eyes: Conjunctivae and lids are normal. Right eye exhibits no discharge. Left eye exhibits no discharge. No scleral icterus.  Pulmonary/Chest: Effort normal. No respiratory distress.  Musculoskeletal: Normal range of motion. He exhibits edema and tenderness.  Well healed scar right knee from total replacement by Dr. Marry Guan. Swelling of the right foot with a red 2 cm tender spot on the lateral foot. Symmetric pulses.  Neurological: He is alert and oriented to  person, place, and time.  Skin: Skin is intact. No lesion and no rash noted. There is erythema.  No open sores on the right foot.  Psychiatric: He has a normal mood and affect. His speech is normal and behavior is normal. Thought content normal.      Assessment & Plan:     1. Cellulitis of right lower extremity Onset of redness and swelling with some fatigue over the past 10 days. Thought it was a flare of gout but no relief from colchicine. No known injury or insect bite. Will give antibiotic, recommend hot Epsom saltwater soaks and elevation. Check labs and follow up pending reports. - CBC with Differential/Platelet - Cephalexin 500 MG tablet; Take 1 tablet (500 mg total) by mouth 4 (four) times daily.  Dispense: 30 tablet; Refill: 0  2. Decreased libido Over the past few months, he has noticed decrease in libido and erectile dysfunction. Will check labs for metabolic disorder and low testosterone. Some family history for diabetes and he has been gaining weight. - CBC with Differential/Platelet - Comprehensive metabolic panel - Testosterone  3. Chronic gout of foot, unspecified cause, unspecified laterality History of recurrent gout attacks but usually in the left foot. Also, usually responds quickly to the Colchicine. No help with right foot pain and swelling this time. Recheck uric acid level and treat with antibiotic. May need prednisone if uric acid high. - CBC with Differential/Platelet - Uric acid  4. Essential (primary) hypertension Fair control on the Amlodipine 5 mg qd and Metoprolol Succinate 50 mg qd. Continues follow up with Dr. Nehemiah Massed (cardiologist) regularly for chronic atrial fibrillation. Recheck routine labs. - CBC with Differential/Platelet - Comprehensive metabolic panel       Vernie Murders, PA  [facility]

## 2015-12-30 NOTE — Patient Instructions (Signed)

## 2015-12-31 LAB — COMPREHENSIVE METABOLIC PANEL
A/G RATIO: 1.6 (ref 1.2–2.2)
ALBUMIN: 4.5 g/dL (ref 3.5–5.5)
ALT: 19 IU/L (ref 0–44)
AST: 16 IU/L (ref 0–40)
Alkaline Phosphatase: 60 IU/L (ref 39–117)
BILIRUBIN TOTAL: 0.9 mg/dL (ref 0.0–1.2)
BUN / CREAT RATIO: 19 (ref 9–20)
BUN: 19 mg/dL (ref 6–24)
CHLORIDE: 99 mmol/L (ref 96–106)
CO2: 24 mmol/L (ref 18–29)
Calcium: 9.7 mg/dL (ref 8.7–10.2)
Creatinine, Ser: 0.99 mg/dL (ref 0.76–1.27)
GFR, EST AFRICAN AMERICAN: 96 mL/min/{1.73_m2} (ref >59)
GFR, EST NON AFRICAN AMERICAN: 83 mL/min/{1.73_m2} (ref >59)
GLOBULIN, TOTAL: 2.8 g/dL (ref 1.5–4.5)
Glucose: 95 mg/dL (ref 65–99)
POTASSIUM: 4.8 mmol/L (ref 3.5–5.2)
SODIUM: 140 mmol/L (ref 134–144)
TOTAL PROTEIN: 7.3 g/dL (ref 6.0–8.5)

## 2015-12-31 LAB — CBC WITH DIFFERENTIAL/PLATELET
BASOS: 0 %
Basophils Absolute: 0 10*3/uL (ref 0.0–0.2)
EOS (ABSOLUTE): 0.2 10*3/uL (ref 0.0–0.4)
EOS: 2 %
HEMATOCRIT: 41.7 % (ref 37.5–51.0)
Hemoglobin: 14.6 g/dL (ref 12.6–17.7)
IMMATURE GRANS (ABS): 0.1 10*3/uL (ref 0.0–0.1)
Immature Granulocytes: 1 %
LYMPHS: 19 %
Lymphocytes Absolute: 1.8 10*3/uL (ref 0.7–3.1)
MCH: 31.6 pg (ref 26.6–33.0)
MCHC: 35 g/dL (ref 31.5–35.7)
MCV: 90 fL (ref 79–97)
Monocytes Absolute: 1.4 10*3/uL — ABNORMAL HIGH (ref 0.1–0.9)
Monocytes: 15 %
NEUTROS ABS: 5.9 10*3/uL (ref 1.4–7.0)
Neutrophils: 63 %
PLATELETS: 251 10*3/uL (ref 150–379)
RBC: 4.62 x10E6/uL (ref 4.14–5.80)
RDW: 12.6 % (ref 12.3–15.4)
WBC: 9.3 10*3/uL (ref 3.4–10.8)

## 2015-12-31 LAB — URIC ACID: Uric Acid: 9.5 mg/dL — ABNORMAL HIGH (ref 3.7–8.6)

## 2015-12-31 LAB — TESTOSTERONE: Testosterone: 181 ng/dL — ABNORMAL LOW (ref 264–916)

## 2016-01-02 ENCOUNTER — Telehealth: Payer: Self-pay

## 2016-01-02 MED ORDER — PREDNISONE 10 MG PO TABS: ORAL_TABLET | ORAL | 0 refills | Status: DC

## 2016-01-02 NOTE — Telephone Encounter (Signed)
-----   Message from Margo Common, [place] sent at 01/02/2016  9:54 AM EST ----- All blood counts normal except uric acid level high and need Prednisone 10 mg 6 day taper (6,5,4,3,2,1) #21 to take while on the antibiotic. Recheck leg in 1 week. Testosterone a little low. Need to recheck in 3 months to see if recent problems with leg is causing fluctuations. Should be tested fasting in a morning.

## 2016-01-02 NOTE — Telephone Encounter (Signed)
Patient has been notified and prednisone has been called into pharmacy.

## 2016-01-09 ENCOUNTER — Encounter: Payer: Self-pay | Admitting: Family Medicine

## 2016-01-09 ENCOUNTER — Ambulatory Visit (INDEPENDENT_AMBULATORY_CARE_PROVIDER_SITE_OTHER): Payer: BLUE CROSS/BLUE SHIELD | Admitting: Family Medicine

## 2016-01-09 VITALS — BP 122/84 | HR 64 | Temp 97.9°F | Resp 18 | Wt 347.8 lb

## 2016-01-09 DIAGNOSIS — E291 Testicular hypofunction: Secondary | ICD-10-CM

## 2016-01-09 DIAGNOSIS — R7989 Other specified abnormal findings of blood chemistry: Secondary | ICD-10-CM

## 2016-01-09 DIAGNOSIS — M1A0790 Idiopathic chronic gout, unspecified ankle and foot, without tophus (tophi): Secondary | ICD-10-CM

## 2016-01-09 MED ORDER — ALLOPURINOL 100 MG PO TABS: 100.0000 mg | ORAL_TABLET | Freq: Every day | ORAL | 6 refills | Status: DC

## 2016-01-09 NOTE — Progress Notes (Signed)
Patient: Tony Hampton Male    DOB: Aug 04, 1955   60 y.o.   MRN: [id number] Visit Date: 01/09/2016  Today's Provider: Vernie Murders, PA   Chief Complaint  Patient presents with  . Follow-up    cellulitis and gout    Subjective:    HPI Cellulitis right lower extremity and Gout:   1 week follow. The problems has been gradually improving. Associated symptoms comments: Tenderness  Treatments tried: started Cephalexin 500 mg and Prednisone 10 mg 6 day taper. Patient reports excellent compliance with treatment plan.  The treatment provided significant relief.   Patient Active Problem List   Diagnosis Date Noted  . Decreased libido 12/30/2015  . Gout 12/30/2015  . Atrial fibrillation ([place]) 12/13/2014  . Cluster headache, episodic 12/13/2014  . Family history of gout 12/13/2014  . Essential (primary) hypertension 12/13/2014  . Obesity 12/13/2014  . H/O total knee replacement 04/27/2014  . Arthritis of knee, degenerative 03/30/2014  . Compulsive tobacco user syndrome 02/27/1968   Past Surgical History:  Procedure Laterality Date  . KNEE ARTHROSCOPY Right   . 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 Marry Guan  . TONSILLECTOMY     Family History  Problem Relation Age of Onset  . Asthma Mother   . Hypertension Mother   . Hypertension Father   . Diabetes Father   . Breast cancer Sister    No Known Allergies   Previous Medications   ALBUTEROL (PROVENTIL HFA;VENTOLIN HFA) 108 (90 BASE) MCG/ACT INHALER    Inhale 2 puffs into the lungs every 6 (six) hours as needed for wheezing or shortness of breath.   AMLODIPINE (NORVASC) 5 MG TABLET    TAKE 1 TABLET (5 MG TOTAL) BY MOUTH DAILY.   ASPIRIN 81 MG EC TABLET    Take by mouth.   COLCHICINE 0.6 MG TABLET    TAKE 1 TABLET BY MOUTH EVERY 2 HOURS UNTIL GI UPSET   METOPROLOL SUCCINATE (TOPROL-XL) 50 MG 24 HR TABLET    TAKE 1 AND 1/2 TABLET BY MOUTH EVERY DAY   XARELTO 20 MG TABS TABLET        Review of Systems  Constitutional:  Negative.        Tenderness    Respiratory: Negative.   Cardiovascular: Negative.   Musculoskeletal: Negative.     Social History  Substance Use Topics  . Smoking status: Former Research scientist (life sciences)  . Smokeless tobacco: Never Used  . Alcohol use 0.0 oz/week     Comment: OCCASIONALLY   Objective:   BP 122/84 (BP Location: Right Arm, Patient Position: Sitting, Cuff Size: Large)   Pulse 64   Temp 97.9 F (36.6 C) (Oral)   Resp 18   Wt (!) 347 lb 12.8 oz (157.8 kg)   BMI 43.47 kg/m   Physical Exam  Constitutional: He is oriented to person, place, and time. He appears well-developed and well-nourished. No distress.  HENT:  Head: Normocephalic and atraumatic.  Right Ear: Hearing normal.  Left Ear: Hearing normal.  Nose: Nose normal.  Eyes: Conjunctivae and lids are normal. Right eye exhibits no discharge. Left eye exhibits no discharge. No scleral icterus.  Neck: Neck supple.  Cardiovascular: Regular rhythm.   Pulmonary/Chest: Effort normal and breath sounds normal. No respiratory distress.  Abdominal: Soft.  Morbid obesity.  Musculoskeletal: Normal range of motion. He exhibits no tenderness.  Neurological: He is alert and oriented to person, place, and time.  Skin: Skin is intact. No lesion and no rash noted.  Psychiatric: He has a normal mood and affect. His speech is normal and behavior is normal. Thought content normal.      Assessment & Plan:     1. Chronic gout of foot, unspecified cause, unspecified laterality Uric acid level up to 9.5. Has finished the prednisone and acute attack resolved. Will start low purine diet and Allopurinol. Recheck level in 3 months. - allopurinol (ZYLOPRIM) 100 MG tablet; Take 1 tablet (100 mg total) by mouth daily.  Dispense: 30 tablet; Refill: 6  2. Low testosterone in male Decrease in libido and testosterone level 181 on 12-30-15. Will recheck early morning testosterone in 3 months to see that this is true hypogonadism and need for supplement.  Advise weight loss should help this also.

## 2016-01-11 ENCOUNTER — Other Ambulatory Visit: Payer: Self-pay | Admitting: Family Medicine

## 2016-01-12 ENCOUNTER — Telehealth: Payer: Self-pay | Admitting: Family Medicine

## 2016-01-12 NOTE — Telephone Encounter (Signed)
The Allopurinol if to be used daily to try to lower the uric acid level in his blood. The Colchicine is to try to decrease pain and inflammation caused by the high uric acid level ("gout"). May need another prednisone taper if no better with this regimen alone.

## 2016-01-12 NOTE — Telephone Encounter (Signed)
Wife was advised. KW 

## 2016-01-12 NOTE — Telephone Encounter (Signed)
Pt wife, Horris Latino stating pt is having pain in right foot again that started yesterday.  Pt states he has rec'd a Rx to help with this.  Pt states it is gout.  Pt is not sure which medication he is suppose to take to help with this.  Please advise.   CB#[phone]/MW

## 2016-01-12 NOTE — Telephone Encounter (Signed)
Please review

## 2016-01-24 ENCOUNTER — Other Ambulatory Visit: Payer: Self-pay | Admitting: Family Medicine

## 2016-02-10 ENCOUNTER — Encounter: Payer: Self-pay | Admitting: Family Medicine

## 2016-02-10 ENCOUNTER — Ambulatory Visit (INDEPENDENT_AMBULATORY_CARE_PROVIDER_SITE_OTHER): Payer: BLUE CROSS/BLUE SHIELD | Admitting: Family Medicine

## 2016-02-10 VITALS — BP 128/84 | HR 86 | Temp 98.2°F | Resp 18 | Wt 343.0 lb

## 2016-02-10 DIAGNOSIS — M109 Gout, unspecified: Secondary | ICD-10-CM | POA: Diagnosis not present

## 2016-02-10 MED ORDER — PREDNISONE 10 MG PO TABS: 10.0000 mg | ORAL_TABLET | Freq: Every day | ORAL | 0 refills | Status: DC

## 2016-02-10 NOTE — Progress Notes (Signed)
Patient: Tony Hampton Male    DOB: 05/15/1955   60 y.o.   MRN: [id number] Visit Date: 02/10/2016  Today's Provider: Vernie Murders, PA   Chief Complaint  Patient presents with  . Foot Pain   Subjective:    HPI  Patient is here to discuss right foot pain. He was seen on 01/09/16 for gout. He is taking Allopurinol and was taking Colchicine. His symptoms never completely went away after that visit except for 1 day and pain is getting worse this week again. Symptoms are red, swollen and tender right foot/ankle area. Not sure if he has any warmth to the touch. Difficult to walk. Feels like he is running a fever and feels fatigue.    Patient Active Problem List   Diagnosis Date Noted  . Low testosterone in male 01/09/2016  . Decreased libido 12/30/2015  . Gout 12/30/2015  . Atrial fibrillation ([place]) 12/13/2014  . Cluster headache, episodic 12/13/2014  . Family history of gout 12/13/2014  . Essential (primary) hypertension 12/13/2014  . Obesity 12/13/2014  . H/O total knee replacement 04/27/2014  . Arthritis of knee, degenerative 03/30/2014  . Compulsive tobacco user syndrome 02/27/1968   Past Surgical History:  Procedure Laterality Date  . KNEE ARTHROSCOPY Right   . 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 Marry Guan  . TONSILLECTOMY       Family History  Problem Relation Age of Onset  . Asthma Mother   . Hypertension Mother   . Hypertension Father   . Diabetes Father   . Breast cancer Sister    No Known Allergies  Current Outpatient Prescriptions:  .  albuterol (PROVENTIL HFA;VENTOLIN HFA) 108 (90 BASE) MCG/ACT inhaler, Inhale 2 puffs into the lungs every 6 (six) hours as needed for wheezing or shortness of breath., Disp: 1 Inhaler, Rfl: 0 .  allopurinol (ZYLOPRIM) 100 MG tablet, Take 1 tablet (100 mg total) by mouth daily., Disp: 30 tablet, Rfl: 6 .  amLODipine (NORVASC) 5 MG tablet, TAKE 1 TABLET (5 MG TOTAL) BY MOUTH DAILY., Disp: 30 tablet, Rfl: 6 .   colchicine 0.6 MG tablet, TAKE 1 TABLET BY MOUTH EVERY 2 HOURS UNTIL GI UPSET, Disp: 100 tablet, Rfl: 2 .  metoprolol succinate (TOPROL-XL) 50 MG 24 hr tablet, TAKE 1 & 1/2 TABLETS BY MOUTH EVERY DAY **DUE FOR FOLLOW UP APPT IN NEXT 2 MONTHS**, Disp: 135 tablet, Rfl: 3 .  XARELTO 20 MG TABS tablet, , Disp: , Rfl:   Review of Systems  Constitutional: Positive for fatigue.  Respiratory: Negative.   Cardiovascular: Negative.   Musculoskeletal: Positive for arthralgias, gait problem, joint swelling and myalgias.    Social History  Substance Use Topics  . Smoking status: Former Research scientist (life sciences)  . Smokeless tobacco: Never Used  . Alcohol use 0.0 oz/week     Comment: OCCASIONALLY   Objective:   BP 128/84   Pulse 86   Temp 98.2 F (36.8 C)   Resp 18   Wt (!) 343 lb (155.6 kg)   BMI 42.87 kg/m   Physical Exam  Constitutional: He is oriented to person, place, and time. He appears well-developed and well-nourished. No distress.  HENT:  Head: Normocephalic and atraumatic.  Right Ear: Hearing normal.  Left Ear: Hearing normal.  Nose: Nose normal.  Eyes: Conjunctivae and lids are normal. Right eye exhibits no discharge. Left eye exhibits no discharge. No scleral icterus.  Pulmonary/Chest: Effort normal. No respiratory distress.  Musculoskeletal: He exhibits tenderness.  Right foot first MTP joint with erythema and swelling of the foot. Very painful to palpate or test ROM of the great toe.  Neurological: He is alert and oriented to person, place, and time.  Skin: Skin is intact. No lesion and no rash noted.  Psychiatric: He has a normal mood and affect. His speech is normal and behavior is normal. Thought content normal.      Assessment & Plan:     1. Acute gout involving toe of right foot, unspecified cause Intermittent flares since starting Allopurinol in November 2017. Recent flare (the past couple days) has increased pain and swelling. Will give prednisone and recheck CBC, sedrate and Uric  acid level to adjust Allopurinol. (Uric acid level was 9.5 on 12-30-15). Not advisable to take NSAID's since he is on Xarelto daily. - CBC with Differential/Platelet - Uric acid - Sedimentation rate - predniSONE (DELTASONE) 10 MG tablet; Take 1 tablet (10 mg total) by mouth daily with breakfast. Taper down by one tablet daily (6,5,4,3,2,1)  Dispense: 21 tablet; Refill: [place], [place]  [facility]

## 2016-02-11 LAB — CBC WITH DIFFERENTIAL/PLATELET
Basophils Absolute: 0 10*3/uL (ref 0.0–0.2)
Basos: 0 %
EOS (ABSOLUTE): 0.2 10*3/uL (ref 0.0–0.4)
Eos: 2 %
HEMATOCRIT: 45.2 % (ref 37.5–51.0)
HEMOGLOBIN: 15.3 g/dL (ref 13.0–17.7)
IMMATURE GRANULOCYTES: 1 %
Immature Grans (Abs): 0.1 10*3/uL (ref 0.0–0.1)
LYMPHS ABS: 1.5 10*3/uL (ref 0.7–3.1)
Lymphs: 15 %
MCH: 31.3 pg (ref 26.6–33.0)
MCHC: 33.8 g/dL (ref 31.5–35.7)
MCV: 92 fL (ref 79–97)
MONOCYTES: 15 %
Monocytes Absolute: 1.5 10*3/uL — ABNORMAL HIGH (ref 0.1–0.9)
Neutrophils Absolute: 6.6 10*3/uL (ref 1.4–7.0)
Neutrophils: 67 %
Platelets: 238 10*3/uL (ref 150–379)
RBC: 4.89 x10E6/uL (ref 4.14–5.80)
RDW: 13.1 % (ref 12.3–15.4)
WBC: 9.9 10*3/uL (ref 3.4–10.8)

## 2016-02-11 LAB — SEDIMENTATION RATE: SED RATE: 5 mm/h (ref 0–30)

## 2016-02-11 LAB — URIC ACID: Uric Acid: 7.4 mg/dL (ref 3.7–8.6)

## 2016-02-13 ENCOUNTER — Telehealth: Payer: Self-pay

## 2016-02-13 NOTE — Telephone Encounter (Signed)
-----   Message from Margo Common, [place] sent at 02/11/2016 12:43 PM EST ----- No sign of infection on blood cell counts. Uric acid level coming down nicely (was 9.5 - now 7.4 - goal <6.0). Would proceed with prednisone taper and continue the same dose of Allopurinol. Recheck progress in 3 months. See foot again sooner if needed.

## 2016-02-13 NOTE — Telephone Encounter (Signed)
Patient advised. He has a scheduled follow up appointment in February.

## 2016-02-13 NOTE — Telephone Encounter (Signed)
LMTCB

## 2016-02-21 ENCOUNTER — Other Ambulatory Visit: Payer: Self-pay

## 2016-02-21 DIAGNOSIS — M1A0790 Idiopathic chronic gout, unspecified ankle and foot, without tophus (tophi): Secondary | ICD-10-CM

## 2016-02-21 MED ORDER — ALLOPURINOL 100 MG PO TABS: 100.0000 mg | ORAL_TABLET | Freq: Every day | ORAL | 4 refills | Status: DC

## 2016-02-21 NOTE — Telephone Encounter (Signed)
Pharmacy requesting 90 day supply. Last ov 02/10/16. Please review. Thank you. sd

## 2016-02-22 DIAGNOSIS — I340 Nonrheumatic mitral (valve) insufficiency: Secondary | ICD-10-CM | POA: Insufficient documentation

## 2016-04-09 ENCOUNTER — Ambulatory Visit: Payer: BLUE CROSS/BLUE SHIELD | Admitting: Family Medicine

## 2016-04-10 ENCOUNTER — Ambulatory Visit: Payer: BLUE CROSS/BLUE SHIELD | Admitting: Family Medicine

## 2016-05-07 ENCOUNTER — Telehealth: Payer: Self-pay | Admitting: Family Medicine

## 2016-05-07 NOTE — Telephone Encounter (Signed)
Spoke with patient and his wife about ankle pain similar to a gout attack. Will postpone use of steroids and continue Colchicine if needed. Keep appointment next week or come in sooner if needed.

## 2016-05-07 NOTE — Telephone Encounter (Signed)
Pt's wife contacted office for refill request on the following medications: predniSONE (DELTASONE) 10 MG tablet [facility] [address] Last Rx: 02/10/16 Pt's wife stated they believe pt's gout has flared up and would like Rx. Pt has OV scheduled for 05/15/16. I advised wife that we are closing early due to weather and offered to schedule pt to see Simona Huh today. Wife requested the message be sent and they would go from there. Please advise. Thanks TNP

## 2016-05-15 ENCOUNTER — Ambulatory Visit (INDEPENDENT_AMBULATORY_CARE_PROVIDER_SITE_OTHER): Payer: BLUE CROSS/BLUE SHIELD | Admitting: Family Medicine

## 2016-05-15 ENCOUNTER — Encounter: Payer: Self-pay | Admitting: Family Medicine

## 2016-05-15 VITALS — BP 142/90 | HR 64 | Temp 97.7°F | Wt 353.8 lb

## 2016-05-15 DIAGNOSIS — I10 Essential (primary) hypertension: Secondary | ICD-10-CM | POA: Diagnosis not present

## 2016-05-15 DIAGNOSIS — Z6841 Body Mass Index (BMI) 40.0 and over, adult: Secondary | ICD-10-CM

## 2016-05-15 DIAGNOSIS — E6609 Other obesity due to excess calories: Secondary | ICD-10-CM | POA: Diagnosis not present

## 2016-05-15 DIAGNOSIS — I4891 Unspecified atrial fibrillation: Secondary | ICD-10-CM

## 2016-05-15 DIAGNOSIS — M1A0790 Idiopathic chronic gout, unspecified ankle and foot, without tophus (tophi): Secondary | ICD-10-CM

## 2016-05-15 DIAGNOSIS — E291 Testicular hypofunction: Secondary | ICD-10-CM

## 2016-05-15 DIAGNOSIS — IMO0001 Reserved for inherently not codable concepts without codable children: Secondary | ICD-10-CM

## 2016-05-15 DIAGNOSIS — R7989 Other specified abnormal findings of blood chemistry: Secondary | ICD-10-CM

## 2016-05-15 NOTE — Progress Notes (Signed)
Patient: Tony Hampton Male    DOB: 01-Feb-1956   61 y.o.   MRN: [id number] Visit Date: 05/15/2016  Today's Provider: Vernie Murders, PA   Chief Complaint  Patient presents with  . Hypertension  . Gout  . Follow-up   Subjective:    HPI  Hypertension, follow-up:  BP Readings from Last 3 Encounters:  05/15/16 (!) 142/90  02/10/16 128/84  01/09/16 122/84    He was last seen for hypertension 3 months ago.  BP at that visit was 128/84 . Management changes since that visit include continue medication*. He reports good compliance with treatment. He is not having side effects.  He is not exercising. He is adherent to low salt diet.   Outside blood pressures are not being checked. He is experiencing none.  Patient denies chest pain, chest pressure/discomfort, irregular heart beat and palpitations.   Cardiovascular risk factors include advanced age (older than 41 for men, 87 for women) and obesity (BMI >= 30 kg/m2).  Use of agents associated with hypertension: none.     Weight trend: stable Wt Readings from Last 3 Encounters:  05/15/16 (!) 353 lb 12.8 oz (160.5 kg)  02/10/16 (!) 343 lb (155.6 kg)  01/09/16 (!) 347 lb 12.8 oz (157.8 kg)    Current diet: in general, a "healthy" diet    ------------------------------------------------------------------------ Patient Active Problem List   Diagnosis Date Noted  . Moderate mitral insufficiency 02/22/2016  . Low testosterone in male 01/09/2016  . Decreased libido 12/30/2015  . Gout 12/30/2015  . Atrial fibrillation ([place]) 12/13/2014  . Cluster headache, episodic 12/13/2014  . Family history of gout 12/13/2014  . Essential (primary) hypertension 12/13/2014  . Obesity 12/13/2014  . H/O total knee replacement 04/27/2014  . Arthritis of knee, degenerative 03/30/2014  . Compulsive tobacco user syndrome 02/27/1968   Past Surgical History:  Procedure Laterality Date  . KNEE ARTHROSCOPY Right   . REPLACEMENT TOTAL KNEE Right  Feb. 2016   Dr. Marry Guan  . TONSILLECTOMY     Family History  Problem Relation Age of Onset  . Asthma Mother   . Hypertension Mother   . Hypertension Father   . Diabetes Father   . Breast cancer Sister    No Known Allergies   Previous Medications   ALBUTEROL (PROVENTIL HFA;VENTOLIN HFA) 108 (90 BASE) MCG/ACT INHALER    Inhale 2 puffs into the lungs every 6 (six) hours as needed for wheezing or shortness of breath.   ALLOPURINOL (ZYLOPRIM) 100 MG TABLET    Take 1 tablet (100 mg total) by mouth daily.   AMLODIPINE (NORVASC) 5 MG TABLET    TAKE 1 TABLET (5 MG TOTAL) BY MOUTH DAILY.   COLCHICINE 0.6 MG TABLET    TAKE 1 TABLET BY MOUTH EVERY 2 HOURS UNTIL GI UPSET   METOPROLOL SUCCINATE (TOPROL-XL) 50 MG 24 HR TABLET    TAKE 1 & 1/2 TABLETS BY MOUTH EVERY DAY **DUE FOR FOLLOW UP APPT IN NEXT 2 MONTHS**   XARELTO 20 MG TABS TABLET        Review of Systems  Constitutional: Negative.   Respiratory: Positive for shortness of breath (at times).   Cardiovascular: Negative.     Social History  Substance Use Topics  . Smoking status: Former Research scientist (life sciences)  . Smokeless tobacco: Never Used  . Alcohol use 0.0 oz/week     Comment: OCCASIONALLY   Objective:   BP (!) 142/90 (BP Location: Right Arm, Patient Position: Sitting, Cuff Size: Large)   Pulse  64   Temp 97.7 F (36.5 C) (Oral)   Wt (!) 353 lb 12.8 oz (160.5 kg)   SpO2 97%   BMI 44.22 kg/m   Physical Exam  Constitutional: He is oriented to person, place, and time. He appears well-developed and well-nourished. No distress.  Morbid obesity  HENT:  Head: Normocephalic and atraumatic.  Right Ear: Hearing normal.  Left Ear: Hearing normal.  Nose: Nose normal.  Eyes: Conjunctivae and lids are normal. Right eye exhibits no discharge. Left eye exhibits no discharge. No scleral icterus.  Neck: Neck supple. No thyromegaly present.  Cardiovascular: Normal rate, regular rhythm and normal heart sounds.   Pulmonary/Chest: Effort normal and breath  sounds normal. No respiratory distress.  Abdominal: Soft. Bowel sounds are normal.  Musculoskeletal: Normal range of motion.  Lymphadenopathy:    He has no cervical adenopathy.  Neurological: He is alert and oriented to person, place, and time.  Skin: Skin is intact. No lesion and no rash noted.  Psychiatric: He has a normal mood and affect. His speech is normal and behavior is normal. Thought content normal.   Depression screen [facility] 2/9 05/18/2016 05/18/2016  Decreased Interest 0 0  Down, Depressed, Hopeless 0 0  PHQ - 2 Score 0 0       Assessment & Plan:     1. Essential (primary) hypertension Some elevation of BP today. Still taking the Norvasc 5 mg qd with Toprol-XL 50 mg qd. Followed by Dr. Nehemiah Massed (cardiologist) every 6 months (next appointment in 08-22-16) to recheck chronic a. Fib, mixed hyperlipidemia, moderate mitral insufficiency and HBP. Recheck labs and follow up pending reports. - CBC with Differential - Comprehensive Metabolic Panel (CMET)  2. Chronic gout of foot, unspecified cause, unspecified laterality Intermittent attacks. Last left foot pain was 05-07-16. Did not have to take the prednisone. Keeps Colchicine handy for flares. Trying to limit purines in diet but not taking the Allopurinol now. Will check labs and consider routine dosage of Allopurinol to prevent attacks. - CBC with Differential - Uric acid - Comprehensive Metabolic Panel (CMET)  3. Low testosterone in male Still having fatigue and decrease in erectile abilities. Libido is diminished and last testosterone level on 12-30-15 was 181. Will recheck fasting level and consider need for supplementation. - Testosterone  4. Class 3 obesity due to excess calories with serious comorbidity and body mass index (BMI) of 40.0 to 44.9 in adult [facility]) Has gained 10 lbs since 02-10-16 office visit. Gout limits exercise. Recommend low caloric diet and try to walk for exercise. Check labs for other metabolic disorder. -  HgB A1c - Comprehensive Metabolic Panel (CMET)  5. Atrial fibrillation, unspecified type ([facility]) Stable on Metoprolol and Xarelto. Followed routinely by Dr. Nehemiah Massed (cardiologist). No chest pains or palpitations

## 2016-05-22 ENCOUNTER — Telehealth: Payer: Self-pay | Admitting: Family Medicine

## 2016-05-22 ENCOUNTER — Encounter: Payer: Self-pay | Admitting: Family Medicine

## 2016-05-22 NOTE — Telephone Encounter (Signed)
Patient's wife Horris Latino (on [place]) advised of lab results from 05/17/2016.

## 2016-05-22 NOTE — Telephone Encounter (Signed)
This patient sees Ernest Haber

## 2016-05-22 NOTE — Telephone Encounter (Signed)
See message to report results from labs - on report sheet from lab [facility]).

## 2016-05-22 NOTE — Telephone Encounter (Signed)
Pt returned call from yesterday.  teri

## 2016-05-30 ENCOUNTER — Encounter: Payer: Self-pay | Admitting: Physician Assistant

## 2016-05-30 ENCOUNTER — Ambulatory Visit (INDEPENDENT_AMBULATORY_CARE_PROVIDER_SITE_OTHER): Payer: BLUE CROSS/BLUE SHIELD | Admitting: Physician Assistant

## 2016-05-30 VITALS — BP 116/82 | HR 78 | Temp 98.1°F | Resp 16 | Wt 345.0 lb

## 2016-05-30 DIAGNOSIS — J40 Bronchitis, not specified as acute or chronic: Secondary | ICD-10-CM | POA: Diagnosis not present

## 2016-05-30 DIAGNOSIS — B9789 Other viral agents as the cause of diseases classified elsewhere: Secondary | ICD-10-CM

## 2016-05-30 DIAGNOSIS — J069 Acute upper respiratory infection, unspecified: Secondary | ICD-10-CM | POA: Diagnosis not present

## 2016-05-30 NOTE — Patient Instructions (Signed)
MUCinex, may use delsym or cough drops. Use inhaler every 4 hours for the next three days. May call back for an antibiotic after one week of symptoms.  Upper Respiratory Infection, Adult Most upper respiratory infections (URIs) are caused by a virus. A URI affects the nose, throat, and upper air passages. The most common type of URI is often called "the common cold." Follow these instructions at home:  Take medicines only as told by your doctor.  Gargle warm saltwater or take cough drops to comfort your throat as told by your doctor.  Use a warm mist humidifier or inhale steam from a shower to increase air moisture. This may make it easier to breathe.  Drink enough fluid to keep your pee (urine) clear or pale yellow.  Eat soups and other clear broths.  Have a healthy diet.  Rest as needed.  Go back to work when your fever is gone or your doctor says it is okay.  You may need to stay home longer to avoid giving your URI to others.  You can also wear a face mask and wash your hands often to prevent spread of the virus.  Use your inhaler more if you have asthma.  Do not use any tobacco products, including cigarettes, chewing tobacco, or electronic cigarettes. If you need help quitting, ask your doctor. Contact a doctor if:  You are getting worse, not better.  Your symptoms are not helped by medicine.  You have chills.  You are getting more short of breath.  You have brown or red mucus.  You have yellow or brown discharge from your nose.  You have pain in your face, especially when you bend forward.  You have a fever.  You have puffy (swollen) neck glands.  You have pain while swallowing.  You have white areas in the back of your throat. Get help right away if:  You have very bad or constant:  Headache.  Ear pain.  Pain in your forehead, behind your eyes, and over your cheekbones (sinus pain).  Chest pain.  You have long-lasting (chronic) lung disease and  any of the following:  Wheezing.  Long-lasting cough.  Coughing up blood.  A change in your usual mucus.  You have a stiff neck.  You have changes in your:  Vision.  Hearing.  Thinking.  Mood. This information is not intended to replace advice given to you by your health care provider. Make sure you discuss any questions you have with your health care provider. Document Released: 08/01/2007 Document Revised: 10/16/2015 Document Reviewed: 05/20/2013 Elsevier Interactive Patient Education  2017 [employer].

## 2016-05-30 NOTE — Progress Notes (Signed)
Patient: Tony Hampton Male    DOB: October 09, 1955   61 y.o.   MRN: [id number] Visit Date: 05/30/2016  Today's Provider: Trinna Post, PA-C   Chief Complaint  Patient presents with  . URI   Subjective:    URI   This is a new problem. The current episode started in the past 7 days (x 3 days). The problem has been unchanged. Maximum temperature: pt reports his temperature has bee 99 degrees at the most. Associated symptoms include abdominal pain (from coughing), congestion, coughing (dry; has coughed up blood), headaches, sinus pain, a sore throat and wheezing. Pertinent negatives include no chest pain, diarrhea, dysuria, ear pain, nausea, neck pain, plugged ear sensation, rhinorrhea, sneezing, swollen glands or vomiting. Associated symptoms comments: Pt also c/o night and daytime sweats. Pt denies chills, body aches.. Treatments tried: Coricidin HBP. The treatment provided mild relief.   Patient with history of a-fib, HTN and 20 pack year smoking history presenting today with three days of upper respiratory infection. Low grade temperatures with coughing, congestion. He blew his nose at one point and there was blood, and then he coughed and there was small amount of blood on tissue. No travel outside the country, no history of incarceration, no history of homelessness, no history of exposure to TB. No drenching nightweats. Pertinent info above.     No Known Allergies   Current Outpatient Prescriptions:  .  acetaminophen (TYLENOL) 325 MG tablet, Take 650 mg by mouth every 6 (six) hours as needed., Disp: , Rfl:  .  albuterol (PROVENTIL HFA;VENTOLIN HFA) 108 (90 BASE) MCG/ACT inhaler, Inhale 2 puffs into the lungs every 6 (six) hours as needed for wheezing or shortness of breath., Disp: 1 Inhaler, Rfl: 0 .  allopurinol (ZYLOPRIM) 100 MG tablet, Take 1 tablet (100 mg total) by mouth daily., Disp: 90 tablet, Rfl: 4 .  amLODipine (NORVASC) 5 MG tablet, TAKE 1 TABLET (5 MG TOTAL) BY MOUTH  DAILY., Disp: 30 tablet, Rfl: 6 .  metoprolol succinate (TOPROL-XL) 50 MG 24 hr tablet, TAKE 1 & 1/2 TABLETS BY MOUTH EVERY DAY **DUE FOR FOLLOW UP APPT IN NEXT 2 MONTHS**, Disp: 135 tablet, Rfl: 3 .  XARELTO 20 MG TABS tablet, , Disp: , Rfl:  .  colchicine 0.6 MG tablet, TAKE 1 TABLET BY MOUTH EVERY 2 HOURS UNTIL GI UPSET (Patient not taking: Reported on 05/30/2016), Disp: 100 tablet, Rfl: 2  Review of Systems  HENT: Positive for congestion, sinus pain and sore throat. Negative for ear pain, rhinorrhea and sneezing.   Respiratory: Positive for cough (dry; has coughed up blood) and wheezing.   Cardiovascular: Negative for chest pain.  Gastrointestinal: Positive for abdominal pain (from coughing). Negative for diarrhea, nausea and vomiting.  Genitourinary: Negative for dysuria.  Musculoskeletal: Negative for neck pain.  Neurological: Positive for headaches.    Social History  Substance Use Topics  . Smoking status: Former Research scientist (life sciences)  . Smokeless tobacco: Never Used  . Alcohol use 0.0 oz/week     Comment: OCCASIONALLY   Objective:   BP 116/82 (BP Location: Right Arm, Patient Position: Sitting, Cuff Size: Large)   Pulse 78   Temp 98.1 F (36.7 C) (Oral)   Resp 16   Wt (!) 345 lb (156.5 kg)   SpO2 97%   BMI 43.12 kg/m  Vitals:   05/30/16 1505  BP: 116/82  Pulse: 78  Resp: 16  Temp: 98.1 F (36.7 C)  TempSrc: Oral  SpO2: 97%  Weight: (!) 345 lb (156.5 kg)     Physical Exam  Constitutional: He is oriented to person, place, and time. He appears well-developed and well-nourished. No distress.  HENT:  Right Ear: Tympanic membrane and external ear normal.  Left Ear: Tympanic membrane and external ear normal.  Nose: Mucosal edema and rhinorrhea present. Right sinus exhibits no maxillary sinus tenderness and no frontal sinus tenderness. Left sinus exhibits no maxillary sinus tenderness and no frontal sinus tenderness.  Mouth/Throat: Uvula is midline, oropharynx is clear and moist and  mucous membranes are normal. No oropharyngeal exudate.  Clear rhinorrhea. Dried crusted blood in both nares.   Eyes: Conjunctivae are normal. Right eye exhibits discharge. Left eye exhibits discharge.  Watery Discharge   Neck: Normal range of motion. Neck supple.  Cardiovascular: Normal rate and regular rhythm.   Pulmonary/Chest: Effort normal. No respiratory distress. He has wheezes. He has no rales.  No ronchi.   Lymphadenopathy:    He has no cervical adenopathy.  Neurological: He is alert and oriented to person, place, and time.  Skin: Skin is warm and dry. He is not diaphoretic.  Psychiatric: He has a normal mood and affect. His behavior is normal.        Assessment & Plan:     1. Viral URI with cough  Do think this is viral URI right now. Advised OTC tx of symptoms. Continue with coricidin. May use Mucinex and Delsym plain with no decongestants. If in one week he still has symptoms or progressed to sinus infection, will call in antibiotic.   Return if symptoms worsen or fail to improve.  The entirety of the information documented in the History of Present Illness, Review of Systems and Physical Exam were personally obtained by me. Portions of this information were initially documented by Raquel Sarna D and reviewed by me for thoroughness and accuracy.             Trinna Post, PA-C  [facility]

## 2016-08-18 ENCOUNTER — Other Ambulatory Visit: Payer: Self-pay | Admitting: Family Medicine

## 2016-09-05 ENCOUNTER — Ambulatory Visit (INDEPENDENT_AMBULATORY_CARE_PROVIDER_SITE_OTHER): Payer: BLUE CROSS/BLUE SHIELD | Admitting: Physician Assistant

## 2016-09-05 ENCOUNTER — Encounter: Payer: Self-pay | Admitting: Physician Assistant

## 2016-09-05 VITALS — BP 128/78 | HR 84 | Temp 98.0°F | Wt 332.5 lb

## 2016-09-05 DIAGNOSIS — J029 Acute pharyngitis, unspecified: Secondary | ICD-10-CM

## 2016-09-05 MED ORDER — LORATADINE 10 MG PO TABS: 10.0000 mg | ORAL_TABLET | Freq: Every day | ORAL | 11 refills | Status: DC

## 2016-09-05 NOTE — Patient Instructions (Signed)

## 2016-09-05 NOTE — Progress Notes (Signed)
Patient: Tony Hampton Male    DOB: Dec 01, 1955   61 y.o.   MRN: [id number] Visit Date: 09/05/2016  Today's Provider: Trinna Post, PA-C   Chief Complaint  Patient presents with  . Sore Throat   Subjective:      Tony Hampton is a 61 y/o man with 30 pack year smoking history presenting today with mildly sore and scratchy throat ongoing for one week. He does not have fever, chills, runny nose, itchy water eyes. Has been gargling. No ear pain. Some dry coughing.   Sore Throat   This is a new problem. Episode onset: 1 week. The problem has been unchanged. The pain is worse on the left side. There has been no fever. The patient is experiencing no pain. Associated symptoms include coughing. Associated symptoms comments: hoarseness. He has tried nothing for the symptoms.      Previous Medications   ACETAMINOPHEN (TYLENOL) 325 MG TABLET    Take 650 mg by mouth every 6 (six) hours as needed.   ALBUTEROL (PROVENTIL HFA;VENTOLIN HFA) 108 (90 BASE) MCG/ACT INHALER    Inhale 2 puffs into the lungs every 6 (six) hours as needed for wheezing or shortness of breath.   ALLOPURINOL (ZYLOPRIM) 100 MG TABLET    Take 1 tablet (100 mg total) by mouth daily.   AMLODIPINE (NORVASC) 5 MG TABLET    TAKE 1 TABLET (5 MG TOTAL) BY MOUTH DAILY.   COLCHICINE 0.6 MG TABLET    TAKE 1 TABLET BY MOUTH EVERY 2 HOURS UNTIL GI UPSET   METOPROLOL SUCCINATE (TOPROL-XL) 50 MG 24 HR TABLET    TAKE 1 & 1/2 TABLETS BY MOUTH EVERY DAY **DUE FOR FOLLOW UP APPT IN NEXT 2 MONTHS**   XARELTO 20 MG TABS TABLET        Review of Systems  Constitutional: Negative.   HENT: Positive for sore throat and voice change.   Respiratory: Positive for cough.   Cardiovascular: Negative.     Social History  Substance Use Topics  . Smoking status: Former Research scientist (life sciences)  . Smokeless tobacco: Never Used  . Alcohol use 0.0 oz/week     Comment: OCCASIONALLY   Objective:   BP 128/78 (BP Location: Left Arm, Patient Position: Sitting, Cuff Size:  Large)   Pulse 84   Temp 98 F (36.7 C) (Oral)   Wt (!) 332 lb 8 oz (150.8 kg)   SpO2 97%   BMI 41.56 kg/m   Physical Exam  Constitutional: He is oriented to person, place, and time. He appears well-developed and well-nourished.  HENT:  Right Ear: Tympanic membrane and external ear normal.  Left Ear: Tympanic membrane and external ear normal.  Mouth/Throat: Oropharynx is clear and moist. No oropharyngeal exudate, posterior oropharyngeal edema or posterior oropharyngeal erythema.  Eyes: Conjunctivae are normal.  Neck: Neck supple.  Cardiovascular: Normal rate and regular rhythm.   Pulmonary/Chest: Effort normal and breath sounds normal.  Lymphadenopathy:    He has no cervical adenopathy.  Neurological: He is alert and oriented to person, place, and time.  Skin: Skin is warm and dry.  Psychiatric: He has a normal mood and affect. His behavior is normal.        Assessment & Plan:     1. Sore throat  Sore throat for about one week, very mild however, Think likely viral, especially with laryngitis. Counseled patient that with smoking history, if he has persistent hoarseness, we will need to refer him to ENT for possible laryngoscopy.   -  loratadine (CLARITIN) 10 MG tablet; Take 1 tablet (10 mg total) by mouth daily.  Dispense: 30 tablet; Refill: 11    Follow up: Return if symptoms worsen or fail to improve.      The entirety of the information documented in the History of Present Illness, Review of Systems and Physical Exam were personally obtained by me. Portions of this information were initially documented by Tiburcio Pea, CMA and reviewed by me for thoroughness and accuracy.

## 2016-12-03 ENCOUNTER — Other Ambulatory Visit: Payer: Self-pay | Admitting: Family Medicine

## 2016-12-19 ENCOUNTER — Other Ambulatory Visit: Payer: Self-pay | Admitting: Family Medicine

## 2016-12-19 MED ORDER — METOPROLOL SUCCINATE ER 50 MG PO TB24: 75.0000 mg | ORAL_TABLET | Freq: Every day | ORAL | 3 refills | Status: DC

## 2016-12-20 ENCOUNTER — Encounter: Payer: Self-pay | Admitting: Family Medicine

## 2016-12-20 ENCOUNTER — Ambulatory Visit (INDEPENDENT_AMBULATORY_CARE_PROVIDER_SITE_OTHER): Payer: BLUE CROSS/BLUE SHIELD | Admitting: Family Medicine

## 2016-12-20 VITALS — BP 104/70 | HR 63 | Temp 97.9°F | Resp 16 | Wt 337.0 lb

## 2016-12-20 DIAGNOSIS — I4891 Unspecified atrial fibrillation: Secondary | ICD-10-CM

## 2016-12-20 DIAGNOSIS — I10 Essential (primary) hypertension: Secondary | ICD-10-CM | POA: Diagnosis not present

## 2016-12-20 DIAGNOSIS — L600 Ingrowing nail: Secondary | ICD-10-CM

## 2016-12-20 MED ORDER — CEPHALEXIN 500 MG PO CAPS: 500.0000 mg | ORAL_CAPSULE | Freq: Three times a day (TID) | ORAL | 0 refills | Status: DC

## 2016-12-20 NOTE — Progress Notes (Signed)
Patient: Tony Hampton Male    DOB: 07-16-1955   61 y.o.   MRN: [id number] Visit Date: 12/20/2016  Today's Provider: Vernie Murders, PA   Chief Complaint  Patient presents with  . Nail Problem   Subjective:    Patient has had an infected toe nail for 3 weeks. Toe nail on left foot, big toe. Toe is swollen, red and has pus coming coming out.   History reviewed. No pertinent past medical history. Patient Active Problem List   Diagnosis Date Noted  . Moderate mitral insufficiency 02/22/2016  . Low testosterone in male 01/09/2016  . Decreased libido 12/30/2015  . Gout 12/30/2015  . Atrial fibrillation ([place]) 12/13/2014  . Cluster headache, episodic 12/13/2014  . Family history of gout 12/13/2014  . Essential (primary) hypertension 12/13/2014  . Obesity 12/13/2014  . H/O total knee replacement 04/27/2014  . Arthritis of knee, degenerative 03/30/2014  . Compulsive tobacco user syndrome 02/27/1968   Past Surgical History:  Procedure Laterality Date  . KNEE ARTHROSCOPY Right   . 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 Marry Guan  . TONSILLECTOMY     Family History  Problem Relation Age of Onset  . Asthma Mother   . Hypertension Mother   . Hypertension Father   . Diabetes Father   . Breast cancer Sister    No Known Allergies  Current Outpatient Prescriptions:  .  albuterol (PROVENTIL HFA;VENTOLIN HFA) 108 (90 BASE) MCG/ACT inhaler, Inhale 2 puffs into the lungs every 6 (six) hours as needed for wheezing or shortness of breath., Disp: 1 Inhaler, Rfl: 0 .  allopurinol (ZYLOPRIM) 100 MG tablet, Take 1 tablet (100 mg total) by mouth daily., Disp: 90 tablet, Rfl: 4 .  amLODipine (NORVASC) 5 MG tablet, TAKE 1 TABLET (5 MG TOTAL) BY MOUTH DAILY., Disp: 90 tablet, Rfl: 3 .  colchicine 0.6 MG tablet, TAKE 1 TABLET BY MOUTH EVERY 2 HOURS UNTIL GI UPSET, Disp: 100 tablet, Rfl: 2 .  metoprolol succinate (TOPROL-XL) 50 MG 24 hr tablet, Take 2 tablets (100 mg total) by mouth  daily. Take with or immediately following a meal., Disp: 135 tablet, Rfl: 3 .  XARELTO 20 MG TABS tablet, , Disp: , Rfl:   Review of Systems  Constitutional: Negative for appetite change, chills and fever.  Respiratory: Negative for chest tightness, shortness of breath and wheezing.   Cardiovascular: Negative for chest pain and palpitations.  Gastrointestinal: Negative for abdominal pain, nausea and vomiting.    Social History  Substance Use Topics  . Smoking status: Former Research scientist (life sciences)  . Smokeless tobacco: Never Used  . Alcohol use 0.0 oz/week     Comment: OCCASIONALLY   Objective:   BP 104/70 (BP Location: Right Arm, Patient Position: Sitting, Cuff Size: Large)   Pulse 63   Temp 97.9 F (36.6 C) (Oral)   Resp 16   Wt (!) 337 lb (152.9 kg)   SpO2 98%   BMI 42.12 kg/m   Wt Readings from Last 3 Encounters:  12/20/16 (!) 337 lb (152.9 kg)  09/05/16 (!) 332 lb 8 oz (150.8 kg)  05/30/16 (!) 345 lb (156.5 kg)    Vitals:   12/20/16 0845  BP: 104/70  Pulse: 63  Resp: 16  Temp: 97.9 F (36.6 C)  TempSrc: Oral  SpO2: 98%  Weight: (!) 337 lb (152.9 kg)   Physical Exam  Constitutional: He is oriented to person, place, and time. He appears well-developed and well-nourished. No distress.  HENT:  Head: Normocephalic and atraumatic.  Right Ear: Hearing normal.  Left Ear: Hearing normal.  Nose: Nose normal.  Eyes: Conjunctivae and lids are normal. Right eye exhibits no discharge. Left eye exhibits no discharge. No scleral icterus.  Pulmonary/Chest: Effort normal. No respiratory distress.  Musculoskeletal: Normal range of motion. He exhibits tenderness.  Pain and purulent drainage from medial corner of left great toenail. "Proud flesh" growing over the corner of the nail that bleeds easily.  Neurological: He is alert and oriented to person, place, and time.  Skin: Skin is intact. No lesion and no rash noted.  Psychiatric: He has a normal mood and affect. His speech is normal and  behavior is normal. Thought content normal.      Assessment & Plan:     1. Ingrown toenail of left foot with infection Onset over the past 3 weeks. No known injury. Pain and purulent drainage noted. Will treat with antibiotic and recommend 20 minute hot soak in [employer] daily. Recheck in 7-10 days if no better. May need referral to podiatrist. - CBC with Differential/Platelet - cephALEXin (KEFLEX) 500 MG capsule; Take 1 capsule (500 mg total) by mouth 3 (three) times daily.  Dispense: 21 capsule; Refill: 0  2. Essential (primary) hypertension Stable and well controlled with Amlodipine 5 mg qd and Metoprolol 75 mg qd. Denies side effects. Will check labs and follow up pending reports. - CBC with Differential/Platelet - Comprehensive metabolic panel - Lipid panel - TSH  3. Atrial fibrillation, unspecified type ([place]) Stable without palpitations or chest pains. Followed twice a year by cardiologist (Dr. Nehemiah Massed). Continue Xarelto daily. Recheck labs. - Comprehensive metabolic panel - Lipid panel - TSH        Vernie Murders, PA  [facility]

## 2016-12-20 NOTE — Patient Instructions (Signed)
Ingrown Toenail An ingrown toenail occurs when the corner or sides of your toenail grow into the surrounding skin. The big toe is most commonly affected, but it can happen to any of your toes. If your ingrown toenail is not treated, you will be at risk for infection. What are the causes? This condition may be caused by:  Wearing shoes that are too small or tight.  Injury or trauma, such as stubbing your toe or having your toe stepped on.  Improper cutting or care of your toenails.  Being born with (congenital) nail or foot abnormalities, such as having a nail that is too big for your toe.  What increases the risk? Risk factors for an ingrown toenail include:  Age. Your nails tend to thicken as you get older, so ingrown nails are more common in older people.  Diabetes.  Cutting your toenails incorrectly.  Blood circulation problems.  What are the signs or symptoms? Symptoms may include:  Pain, soreness, or tenderness.  Redness.  Swelling.  Hardening of the skin surrounding the toe.  Your ingrown toenail may be infected if there is fluid, pus, or drainage. How is this diagnosed? An ingrown toenail may be diagnosed by medical history and physical exam. If your toenail is infected, your health care provider may test a sample of the drainage. How is this treated? Treatment depends on the severity of your ingrown toenail. Some ingrown toenails may be treated at home. More severe or infected ingrown toenails may require surgery to remove all or part of the nail. Infected ingrown toenails may also be treated with antibiotic medicines. Follow these instructions at home:  If you were prescribed an antibiotic medicine, finish all of it even if you start to feel better.  Soak your foot in warm soapy water for 20 minutes, 3 times per day or as directed by your health care provider.  Carefully lift the edge of the nail away from the sore skin by wedging a small piece of cotton under  the corner of the nail. This may help with the pain. Be careful not to cause more injury to the area.  Wear shoes that fit well. If your ingrown toenail is causing you pain, try wearing sandals, if possible.  Trim your toenails regularly and carefully. Do not cut them in a curved shape. Cut your toenails straight across. This prevents injury to the skin at the corners of the toenail.  Keep your feet clean and dry.  If you are having trouble walking and are given crutches by your health care provider, use them as directed.  Do not pick at your toenail or try to remove it yourself.  Take medicines only as directed by your health care provider.  Keep all follow-up visits as directed by your health care provider. This is important. Contact a health care provider if:  Your symptoms do not improve with treatment. Get help right away if:  You have red streaks that start at your foot and go up your leg.  You have a fever.  You have increased redness, swelling, or pain.  You have fluid, blood, or pus coming from your toenail. This information is not intended to replace advice given to you by your health care provider. Make sure you discuss any questions you have with your health care provider. Document Released: 02/10/2000 Document Revised: 07/15/2015 Document Reviewed: 01/06/2014 Elsevier Interactive Patient Education  2018 [employer] Inc.  

## 2017-01-03 ENCOUNTER — Encounter: Payer: Self-pay | Admitting: Podiatry

## 2017-01-03 ENCOUNTER — Ambulatory Visit: Payer: BLUE CROSS/BLUE SHIELD | Admitting: Podiatry

## 2017-01-03 VITALS — BP 140/97 | HR 69

## 2017-01-03 DIAGNOSIS — D689 Coagulation defect, unspecified: Secondary | ICD-10-CM

## 2017-01-03 DIAGNOSIS — L03032 Cellulitis of left toe: Secondary | ICD-10-CM | POA: Diagnosis not present

## 2017-01-03 MED ORDER — DOXYCYCLINE HYCLATE 100 MG PO TABS: 100.0000 mg | ORAL_TABLET | Freq: Two times a day (BID) | ORAL | 0 refills | Status: DC

## 2017-01-03 NOTE — Progress Notes (Signed)
   Subjective:    Patient ID: Tony Hampton, male    DOB: Oct 18, 1955, 61 y.o.   MRN: [id number]  HPIthis patient presents the office with chief complaint of a painful infected ingrowing toenail on the inside border big toenail, left foot.  He says he was seen by his medical doctor who put him on antibiotics for the infection and told him to make an appointment with the podiatrist.  He presents the office today stating he has an infected ingrowing toenail left big toe, which is painful walking and wearing his shoes.  He says this started  In  August when he personally cut out an ingrowing toenail.  He says that it initially was better, but approximately 3-4 weeks ago, It started to get  red  inflamed and painful.  He has been taking cephalexin for the infection, but the problem persists.  Patient is on Xarelto for atrial fib.  He presents the office today for definitive evaluation and treatment.    Review of Systems     Objective:   Physical Exam General Appearance  Alert, conversant and in no acute stress.  Vascular  Dorsalis pedis and posterior pulses are palpable  bilaterally.  Capillary return is within normal limits  Bilaterally. Temperature is within normal limits  Bilaterally  Neurologic  Senn-Weinstein monofilament wire test within normal limits  bilaterally. Muscle power  Within normal limits bilaterally.  Nails marked incurvation noted along the medial border of the left great toe.  Granulation tissue is noted at the distal aspect of the nail border.  Orthopedic  No limitations of motion of motion feet bilaterally.  No crepitus or effusions noted.  No bony pathology or digital deformities noted.  Skin  normotropic skin with no porokeratosis noted bilaterally.  No signs of infections or ulcers noted.          Assessment & Plan:  Paronychia medial border left great toenail.  IE  Patient presented with a paronychia.  Due to his infection, he needs to have an incision and drainage  of the nail border performed.  This patient is on xarelto for atrial fib.   I therefore put him on doxycycline for the infection and recommended he make an appointment for 3 days for his incision and drainage.  In the meantime, he needs to stop taking his xarelto.  I had contacted Fannie Knee from [facility] and he said that 3 days is an appropriate time span to be off his medicine.  Patient is scheduled for his nail surgery in 3 days.  Home soaking instructions were given to this patient.   Gardiner Barefoot DPM

## 2017-01-07 ENCOUNTER — Encounter: Payer: Self-pay | Admitting: Podiatry

## 2017-01-07 ENCOUNTER — Ambulatory Visit (INDEPENDENT_AMBULATORY_CARE_PROVIDER_SITE_OTHER): Payer: BLUE CROSS/BLUE SHIELD | Admitting: Podiatry

## 2017-01-07 DIAGNOSIS — L03032 Cellulitis of left toe: Secondary | ICD-10-CM | POA: Diagnosis not present

## 2017-01-07 NOTE — Progress Notes (Signed)
   Subjective:    Patient ID: Tony Hampton, male    DOB: 1955/06/14, 61 y.o.   MRN: [id number]  HPIthis patient presents the office with chief complaint of a painful infected ingrowing toenail .  He was seen last Thursday and diagnosed with a paronychia but he is on xarelto.  Therefore, I told him to take antibiotics for the weekend and prescribed doxycycline.  He was also told to discontinue his xarelto  until his visit this morning.  He presents the office saying that his toe has started to improve over the weekend.  He presents for continued treatment. Evaluation of his ingrowing toenail.    Review of Systems     Objective:   Physical Exam General Appearance  Alert, conversant and in no acute stress.  Vascular  Dorsalis pedis and posterior pulses are palpable  bilaterally.  Capillary return is within normal limits  Bilaterally. Temperature is within normal limits  Bilaterally  Neurologic  Senn-Weinstein monofilament wire test within normal limits  bilaterally. Muscle power  Within normal limits bilaterally.  Nails marked incurvation noted along the medial border of the left great toe.  Granulation tissue is noted at the distal aspect of the nail border.  Orthopedic  No limitations of motion of motion feet bilaterally.  No crepitus or effusions noted.  No bony pathology or digital deformities noted.  Skin  normotropic skin with no porokeratosis noted bilaterally.  No signs of infections or ulcers noted.          Assessment & Plan:  Paronychia medial border left great toenail.  ROV  Nail surgery.  Treatment options and alternatives discussed.  Recommended permanent phenol matrixectomy and patient agreed.  Left hallux  was prepped with alcohol and a toe block of 3cc of 2% lidocaine plain was administered in a digital toe block. .  The toe was then prepped with betadine solution .  The offending nail border was then excised and matrix tissue exposed.  Phenol was then applied to the  matrix tissue followed by an alcohol wash.  Antibiotic ointment and a dry sterile dressing was applied. Surgicell was used in bandaging surgical site.  The patient was dispensed instructions for aftercare. Continue antibiotics.  RTC 1 week.     Gardiner Barefoot DPM    Gardiner Barefoot DPM

## 2017-01-14 ENCOUNTER — Encounter: Payer: Self-pay | Admitting: Podiatry

## 2017-01-14 ENCOUNTER — Ambulatory Visit (INDEPENDENT_AMBULATORY_CARE_PROVIDER_SITE_OTHER): Payer: BLUE CROSS/BLUE SHIELD | Admitting: Podiatry

## 2017-01-14 DIAGNOSIS — Z09 Encounter for follow-up examination after completed treatment for conditions other than malignant neoplasm: Secondary | ICD-10-CM

## 2017-01-14 DIAGNOSIS — L03032 Cellulitis of left toe: Secondary | ICD-10-CM

## 2017-01-14 NOTE — Progress Notes (Signed)
This patient returns to the office following nail surgery one week ago.  The patient says toe has been soaked and bandaged as directed.  There has been improvement of the toe since the surgery has been performed. The patient presents for continued evaluation and treatment.  GENERAL APPEARANCE: Alert, conversant. Appropriately groomed. No acute distress.  VASCULAR: Pedal pulses palpable at  DP and PT bilateral.  Capillary refill time is immediate to all digits,  Normal temperature gradient.    NEUROLOGIC: sensation is normal to 5.07 monofilament at 5/5 sites bilateral.  Light touch is intact bilateral, Muscle strength normal.  MUSCULOSKELETAL: acceptable muscle strength, tone and stability bilateral.  Intrinsic muscluature intact bilateral.  Rectus appearance of foot and digits noted bilateral.   DERMATOLOGIC: skin color, texture, and turgor are within normal limits.  No preulcerative lesions or ulcers  are seen, no interdigital maceration noted.   NAILS  There is necrotic tissue along the nail groove  In the absence of redness swelling and pain.  DX  S/p nail surgery  ROV  Home instructions were discussed.  Patient to call the office if there are any questions or concerns.   Jahseh Lucchese DPM   

## 2017-01-29 LAB — COMPLETE METABOLIC PANEL WITH GFR
AG Ratio: 1.6 (calc) (ref 1.0–2.5)
ALBUMIN MSPROF: 4.3 g/dL (ref 3.6–5.1)
ALKALINE PHOSPHATASE (APISO): 55 U/L (ref 40–115)
ALT: 13 U/L (ref 9–46)
AST: 12 U/L (ref 10–35)
BILIRUBIN TOTAL: 1 mg/dL (ref 0.2–1.2)
BUN: 16 mg/dL (ref 7–25)
CHLORIDE: 104 mmol/L (ref 98–110)
CO2: 26 mmol/L (ref 20–32)
Calcium: 9.8 mg/dL (ref 8.6–10.3)
Creat: 1.02 mg/dL (ref 0.70–1.25)
GFR, Est African American: 92 mL/min/{1.73_m2} (ref >=60)
GFR, Est Non African American: 80 mL/min/{1.73_m2} (ref >=60)
GLUCOSE: 103 mg/dL — AB (ref 65–99)
Globulin: 2.7 g/dL (calc) (ref 1.9–3.7)
Potassium: 4.3 mmol/L (ref 3.5–5.3)
Sodium: 137 mmol/L (ref 135–146)
Total Protein: 7 g/dL (ref 6.1–8.1)

## 2017-01-29 LAB — CBC WITH DIFFERENTIAL/PLATELET
BASOS PCT: 0.5 %
Basophils Absolute: 30 cells/uL (ref 0–200)
Eosinophils Absolute: 212 cells/uL (ref 15–500)
Eosinophils Relative: 3.6 %
HEMATOCRIT: 44.2 % (ref 38.5–50.0)
HEMOGLOBIN: 15.1 g/dL (ref 13.2–17.1)
Lymphs Abs: 1316 cells/uL (ref 850–3900)
MCH: 31.5 pg (ref 27.0–33.0)
MCHC: 34.2 g/dL (ref 32.0–36.0)
MCV: 92.1 fL (ref 80.0–100.0)
MPV: 11.1 fL (ref 7.5–12.5)
Monocytes Relative: 11.3 %
NEUTROS ABS: 3676 {cells}/uL (ref 1500–7800)
Neutrophils Relative %: 62.3 %
Platelets: 203 10*3/uL (ref 140–400)
RBC: 4.8 10*6/uL (ref 4.20–5.80)
RDW: 11.7 % (ref 11.0–15.0)
Total Lymphocyte: 22.3 %
WBC: 5.9 10*3/uL (ref 3.8–10.8)
WBCMIX: 667 {cells}/uL (ref 200–950)

## 2017-01-29 LAB — LIPID PANEL
CHOL/HDL RATIO: 3.7 (calc) (ref <5.0)
CHOLESTEROL: 151 mg/dL (ref <200)
HDL: 41 mg/dL (ref >40)
LDL CHOLESTEROL (CALC): 83 mg/dL
NON-HDL CHOLESTEROL (CALC): 110 mg/dL (ref <130)
Triglycerides: 169 mg/dL — ABNORMAL HIGH (ref <150)

## 2017-01-29 LAB — TSH: TSH: 1.62 m[IU]/L (ref 0.40–4.50)

## 2017-03-09 ENCOUNTER — Other Ambulatory Visit: Payer: Self-pay | Admitting: Family Medicine

## 2017-03-12 ENCOUNTER — Other Ambulatory Visit: Payer: Self-pay | Admitting: Family Medicine

## 2017-03-12 MED ORDER — COLCHICINE 0.6 MG PO TABS: ORAL_TABLET | ORAL | 0 refills | Status: DC

## 2017-03-12 NOTE — Telephone Encounter (Signed)
Patient advised. He states he will call back to schedule a follow up appointment.  °

## 2017-03-12 NOTE — Telephone Encounter (Signed)
Will refill Colchicine, but, need to schedule follow up appointment and labs.

## 2017-03-12 NOTE — Telephone Encounter (Signed)
DC--If you could take this Rx as I am unfamikliar with pt. Thx

## 2017-05-03 ENCOUNTER — Other Ambulatory Visit: Payer: Self-pay | Admitting: Family Medicine

## 2017-05-03 DIAGNOSIS — M1A0790 Idiopathic chronic gout, unspecified ankle and foot, without tophus (tophi): Secondary | ICD-10-CM

## 2017-09-10 ENCOUNTER — Other Ambulatory Visit: Payer: Self-pay | Admitting: Family Medicine

## 2017-12-03 ENCOUNTER — Encounter: Payer: Self-pay | Admitting: Family Medicine

## 2017-12-03 ENCOUNTER — Ambulatory Visit: Payer: BLUE CROSS/BLUE SHIELD | Admitting: Family Medicine

## 2017-12-03 ENCOUNTER — Other Ambulatory Visit: Payer: Self-pay

## 2017-12-03 VITALS — BP 110/78 | HR 67 | Temp 97.7°F | Ht 76.0 in | Wt 348.0 lb

## 2017-12-03 DIAGNOSIS — S61239A Puncture wound without foreign body of unspecified finger without damage to nail, initial encounter: Secondary | ICD-10-CM

## 2017-12-03 DIAGNOSIS — L089 Local infection of the skin and subcutaneous tissue, unspecified: Secondary | ICD-10-CM | POA: Diagnosis not present

## 2017-12-03 MED ORDER — DOXYCYCLINE HYCLATE 100 MG PO TABS: 100.0000 mg | ORAL_TABLET | Freq: Two times a day (BID) | ORAL | 0 refills | Status: DC

## 2017-12-03 NOTE — Progress Notes (Signed)
Patient: Tony Hampton Male    DOB: Nov 16, 1955   62 y.o.   MRN: [id number] Visit Date: 12/03/2017  Today's Provider: Vernie Murders, PA   Chief Complaint  Patient presents with  . Hand Pain    right ring finger had splinter and red, swollen and pain   Subjective:    HPI pt reports he had a splinter in his right hand at ring finger and thinks he got it out but its red and swollen and has pain.     Past Medical History:  Diagnosis Date  . Hypertension    Past Surgical History:  Procedure Laterality Date  . KNEE ARTHROSCOPY Right   . 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 Marry Guan  . TONSILLECTOMY     Family History  Problem Relation Age of Onset  . Asthma Mother   . Hypertension Mother   . Hypertension Father   . Diabetes Father   . Breast cancer Sister    No Known Allergies  Current Outpatient Medications:  .  albuterol (PROVENTIL HFA;VENTOLIN HFA) 108 (90 BASE) MCG/ACT inhaler, Inhale 2 puffs into the lungs every 6 (six) hours as needed for wheezing or shortness of breath., Disp: 1 Inhaler, Rfl: 0 .  allopurinol (ZYLOPRIM) 100 MG tablet, TAKE 1 TABLET (100 MG TOTAL) BY MOUTH DAILY., Disp: 90 tablet, Rfl: 3 .  amLODipine (NORVASC) 5 MG tablet, TAKE 1 TABLET (5 MG TOTAL) BY MOUTH DAILY., Disp: 90 tablet, Rfl: 3 .  colchicine 0.6 MG tablet, Two tablets by mouth at onset of gout followed by 1 tablet in 2 hours as needed., Disp: 100 tablet, Rfl: 0 .  doxycycline (VIBRA-TABS) 100 MG tablet, Take 1 tablet (100 mg total) 2 (two) times daily by mouth., Disp: 20 tablet, Rfl: 0 .  metoprolol succinate (TOPROL-XL) 50 MG 24 hr tablet, TAKE 1 & 1/2 TABLETS BY MOUTH EVERY DAY **DUE FOR FOLLOW UP APPT IN NEXT 2 MONTHS**, Disp: 135 tablet, Rfl: 1 .  XARELTO 20 MG TABS tablet, , Disp: , Rfl:  .  cephALEXin (KEFLEX) 500 MG capsule, TAKE 1 CAPSULE BY MOUTH THREE TIMES A DAY, Disp: , Rfl: 0  Review of Systems  Constitutional: Negative.   HENT: Negative.   Eyes: Negative.     Respiratory: Negative.   Cardiovascular: Negative.   Gastrointestinal: Negative.   Endocrine: Negative.   Genitourinary: Negative.   Musculoskeletal: Negative.   Skin: Negative.   Allergic/Immunologic: Negative.   Neurological: Negative.   Hematological: Negative.   Psychiatric/Behavioral: Negative.    Social History   Tobacco Use  . Smoking status: Former Research scientist (life sciences)  . Smokeless tobacco: Never Used  Substance Use Topics  . Alcohol use: Yes    Alcohol/week: 0.0 standard drinks    Comment: OCCASIONALLY   Objective:   BP 110/78 (BP Location: Right Arm, Patient Position: Sitting, Cuff Size: Large)   Pulse 67   Temp 97.7 F (36.5 C) (Oral)   Ht 6\' 4"  (1.93 m)   Wt (!) 348 lb (157.9 kg)   SpO2 97%   BMI 42.36 kg/m  Vitals:   12/03/17 0819  BP: 110/78  Pulse: 67  Temp: 97.7 F (36.5 C)  TempSrc: Oral  SpO2: 97%  Weight: (!) 348 lb (157.9 kg)  Height: 6\' 4"  (1.93 m)   Physical Exam  Constitutional: He is oriented to person, place, and time. He appears well-developed and well-nourished. No distress.  HENT:  Head: Normocephalic and atraumatic.  Right Ear:  Hearing normal.  Left Ear: Hearing normal.  Nose: Nose normal.  Eyes: Conjunctivae and lids are normal. Right eye exhibits no discharge. Left eye exhibits no discharge. No scleral icterus.  Pulmonary/Chest: Effort normal. No respiratory distress.  Musculoskeletal: Normal range of motion.  Neurological: He is alert and oriented to person, place, and time.  Skin: Skin is intact. No lesion and no rash noted.  Erythema and tenderness volar surface of hand at the base of the right ring finger. Scabbed puncture site. No discharge or lymphangitis.  Psychiatric: He has a normal mood and affect. His speech is normal and behavior is normal. Thought content normal.      Assessment & Plan:     1. Infected puncture wound of finger, initial encounter Picked up an old piece of wood and got a splinter in the right ring finger a  week ago. Removed the splinter and tried to soak in some warm saltwater. Still has redness and soreness at the base of the finger - volar surface at the MCP joint. No sign of lymphangitis or drainage. Treat with antibiotic and recheck if no better in 7-10 days. Last tetanus booster was given 12-21-14. - doxycycline (VIBRA-TABS) 100 MG tablet; Take 1 tablet (100 mg total) by mouth 2 (two) times daily.  Dispense: 20 tablet; Refill: [place], PA  [facility]

## 2017-12-03 NOTE — Patient Instructions (Signed)
Wound Infection A wound infection happens when germs start to grow in the wound. Germs that cause wound infections are most commonly bacteria. Other types of infections can occur as well. In some cases, infection can cause the wound to break open. Wound infections need treatment. If a wound infection is left untreated, complications can occur. This may include an infection in your bloodstream (sepsis) or in a bone (osteomyelitis). What are the causes? This condition is caused by germs growing in the wound. What increases the risk? The following factors may make you more likely to develop this condition:  Having a weak body defense system (immune system).  Having diabetes.  Taking steroid medicines for a long time (chronic use).  Smoking.  Older age.  Being overweight.  What are the signs or symptoms? Symptoms of this condition include:  Having more redness, swelling, or pain at the wound site.  Having more blood, pus, or fluid at the wound site.  A bad smell coming from a wound or bandage (dressing).  Having a fever.  Feeling tired or fatigued.  How is this diagnosed? This condition is diagnosed with a medical history and physical exam. You may also have blood tests. How is this treated? This condition is treated with an antibiotic medicine. The infection should improve 24-48 hours after you start antibiotics. Any redness around the wound should stop spreading, and the wound should be less painful. Follow these instructions at home: Medicines  Take or apply over-the-counter and prescription medicines only as told by your health care provider.  If you were prescribed antibiotic medicine, take or apply it as told by your health care provider. Do not stop using the antibiotic even if your condition improves. Wound care  Clean the wound each day or as told by your health care provider. ? Wash the wound with mild soap and water. ? Rinse the wound with water to remove all  soap. ? Pat the wound dry with a clean towel. Do not rub it.  Follow instructions from your health care provider about how to take care of your wound. Make sure you: ? Wash your hands with soap and water before you change your dressing. If soap and water are not available, use hand sanitizer. ? Change your dressing as told by your health care provider. ? Leave stitches (sutures), skin glue, or adhesive strips in place, if this applies. These skin closures may need to stay in place for 2 weeks or longer. If adhesive strip edges start to loosen and curl up, you may trim the loose edges. Do not remove adhesive strips completely unless your health care provider tells you to do that. Some wounds are left open to heal on their own.  Check your wound every day for signs of infection. Watch for: ? More redness, swelling, or pain. ? More fluid or blood. ? Warmth. ? Pus or a bad smell. General instructions  Keep the dressing dry until your health care provider says it can be removed.  Do not take baths, swim, use a hot tub, or do anything that would put your wound underwater until your health care provider approves.  Raise (elevate) the injured area above the level of your heart while you are sitting or lying down.  Do not scratch or pick at the wound.  Keep all follow-up visits as told by your health care provider. This is important. Contact a health care provider if:  Your pain is not controlled with medicine.  You have more   redness, swelling, or pain around your wound.  You have more fluid or blood coming from your wound.  Your wound feels warm to the touch.  You have pus coming from your wound.  You continue to notice a bad smell coming from your wound or your dressing.  Your wound that was closed breaks open. Get help right away if:  You have a red streak going away from your wound.  You have a fever. This information is not intended to replace advice given to you by your  health care provider. Make sure you discuss any questions you have with your health care provider. Document Released: 11/11/2002 Document Revised: 07/27/2015 Document Reviewed: 08/02/2014 Elsevier Interactive Patient Education  2018 [employer] Inc.  

## 2018-03-20 ENCOUNTER — Other Ambulatory Visit: Payer: Self-pay | Admitting: Family Medicine

## 2018-03-20 DIAGNOSIS — M1A0790 Idiopathic chronic gout, unspecified ankle and foot, without tophus (tophi): Secondary | ICD-10-CM

## 2018-05-14 ENCOUNTER — Other Ambulatory Visit: Payer: Self-pay | Admitting: Family Medicine

## 2018-05-14 DIAGNOSIS — M1A0790 Idiopathic chronic gout, unspecified ankle and foot, without tophus (tophi): Secondary | ICD-10-CM

## 2018-05-15 ENCOUNTER — Other Ambulatory Visit: Payer: Self-pay | Admitting: Family Medicine

## 2018-06-14 ENCOUNTER — Other Ambulatory Visit: Payer: Self-pay | Admitting: Family Medicine

## 2018-09-06 ENCOUNTER — Other Ambulatory Visit: Payer: Self-pay | Admitting: Family Medicine

## 2018-11-19 ENCOUNTER — Other Ambulatory Visit: Payer: Self-pay | Admitting: Family Medicine

## 2018-11-19 DIAGNOSIS — M1A0790 Idiopathic chronic gout, unspecified ankle and foot, without tophus (tophi): Secondary | ICD-10-CM

## 2018-12-17 ENCOUNTER — Other Ambulatory Visit: Payer: Self-pay | Admitting: Family Medicine

## 2019-02-02 ENCOUNTER — Other Ambulatory Visit: Payer: Self-pay | Admitting: Family Medicine

## 2019-03-30 ENCOUNTER — Other Ambulatory Visit: Payer: Self-pay | Admitting: Family Medicine

## 2019-03-30 DIAGNOSIS — M1A0790 Idiopathic chronic gout, unspecified ankle and foot, without tophus (tophi): Secondary | ICD-10-CM

## 2019-04-04 ENCOUNTER — Other Ambulatory Visit: Payer: Self-pay | Admitting: Family Medicine

## 2019-06-30 ENCOUNTER — Other Ambulatory Visit: Payer: Self-pay | Admitting: Family Medicine

## 2019-06-30 DIAGNOSIS — M1A0790 Idiopathic chronic gout, unspecified ankle and foot, without tophus (tophi): Secondary | ICD-10-CM

## 2019-06-30 NOTE — Telephone Encounter (Signed)
Attempted to call patient to schedule appt for refill. Call was answered and then patient disconnected the call. Unable to refill per protocol.

## 2019-07-21 ENCOUNTER — Other Ambulatory Visit: Payer: Self-pay | Admitting: Family Medicine

## 2019-07-21 DIAGNOSIS — M1A0790 Idiopathic chronic gout, unspecified ankle and foot, without tophus (tophi): Secondary | ICD-10-CM

## 2019-07-22 ENCOUNTER — Telehealth: Payer: Self-pay

## 2019-07-22 NOTE — Telephone Encounter (Signed)
Copied from [facility] [id number]. Topic: General - Other >> Jul 22, 2019  4:12 PM Leward Quan A wrote: Reason for CRM: Patient wife called in again to ask Dr Natale Milch about sending an emergency supply of colchicine 0.6 MG tablet to the pharmacy due to gout flare up. Please advise

## 2019-07-22 NOTE — Telephone Encounter (Signed)
Patient inquired if he could get a partial refill until appointment tomorrow.

## 2019-07-23 NOTE — Telephone Encounter (Signed)
Sent refill to the pharmacy. Remind patient he is due for follow up appointment and fasting labs.

## 2019-07-24 ENCOUNTER — Encounter: Payer: Self-pay | Admitting: Family Medicine

## 2019-07-24 ENCOUNTER — Other Ambulatory Visit: Payer: Self-pay

## 2019-07-24 ENCOUNTER — Ambulatory Visit: Payer: BC Managed Care – PPO | Admitting: Family Medicine

## 2019-07-24 VITALS — BP 122/85 | HR 64 | Temp 96.9°F | Resp 18 | Ht 76.0 in | Wt 357.0 lb

## 2019-07-24 DIAGNOSIS — I4891 Unspecified atrial fibrillation: Secondary | ICD-10-CM | POA: Diagnosis not present

## 2019-07-24 DIAGNOSIS — I10 Essential (primary) hypertension: Secondary | ICD-10-CM | POA: Diagnosis not present

## 2019-07-24 DIAGNOSIS — M1A0790 Idiopathic chronic gout, unspecified ankle and foot, without tophus (tophi): Secondary | ICD-10-CM | POA: Diagnosis not present

## 2019-07-24 DIAGNOSIS — R351 Nocturia: Secondary | ICD-10-CM | POA: Diagnosis not present

## 2019-07-24 NOTE — Patient Instructions (Signed)
Low-Purine Eating Plan A low-purine eating plan involves making food choices to limit your intake of purine. Purine is a kind of uric acid. Too much uric acid in your blood can cause certain conditions, such as gout and kidney stones. Eating a low-purine diet can help control these conditions. What are tips for following this plan? Reading food labels   Avoid foods with saturated or Trans fat.  Check the ingredient list of grains-based foods, such as bread and cereal, to make sure that they contain whole grains.  Check the ingredient list of sauces or soups to make sure they do not contain meat or fish.  When choosing soft drinks, check the ingredient list to make sure they do not contain high-fructose corn syrup. Shopping  Buy plenty of fresh fruits and vegetables.  Avoid buying canned or fresh fish.  Buy dairy products labeled as low-fat or nonfat.  Avoid buying premade or processed foods. These foods are often high in fat, salt (sodium), and added sugar. Cooking  Use olive oil instead of butter when cooking. Oils like olive oil, canola oil, and sunflower oil contain healthy fats. Meal planning  Learn which foods do or do not affect you. If you find out that a food tends to cause your gout symptoms to flare up, avoid eating that food. You can enjoy foods that do not cause problems. If you have any questions about a food item, talk with your dietitian or health care provider.  Limit foods high in fat, especially saturated fat. Fat makes it harder for your body to get rid of uric acid.  Choose foods that are lower in fat and are lean sources of protein. General guidelines  Limit alcohol intake to no more than 1 drink a day for nonpregnant women and 2 drinks a day for men. One drink equals 12 oz of beer, 5 oz of wine, or 1 oz of hard liquor. Alcohol can affect the way your body gets rid of uric acid.  Drink plenty of water to keep your urine clear or pale yellow. Fluids can help  remove uric acid from your body.  If directed by your health care provider, take a vitamin C supplement.  Work with your health care provider and dietitian to develop a plan to achieve or maintain a healthy weight. Losing weight can help reduce uric acid in your blood. What foods are recommended? The items listed may not be a complete list. Talk with your dietitian about what dietary choices are best for you. Foods low in purines Foods low in purines do not need to be limited. These include:  All fruits.  All low-purine vegetables, pickles, and olives.  Breads, pasta, rice, cornbread, and popcorn. Cake and other baked goods.  All dairy foods.  Eggs, nuts, and nut butters.  Spices and condiments, such as salt, herbs, and vinegar.  Plant oils, butter, and margarine.  Water, sugar-free soft drinks, tea, coffee, and cocoa.  Vegetable-based soups, broths, sauces, and gravies. Foods moderate in purines Foods moderate in purines should be limited to the amounts listed.   cup of asparagus, cauliflower, spinach, mushrooms, or green peas, each day.  2/3 cup uncooked oatmeal, each day.   cup dry wheat bran or wheat germ, each day.  2-3 ounces of meat or poultry, each day.  4-6 ounces of shellfish, such as crab, lobster, oysters, or shrimp, each day.  1 cup cooked beans, peas, or lentils, each day.  Soup, broths, or bouillon made from meat or   fish. Limit these foods as much as possible. What foods are not recommended? The items listed may not be a complete list. Talk with your dietitian about what dietary choices are best for you. Limit your intake of foods high in purines, including:  Beer and other alcohol.  Meat-based gravy or sauce.  Canned or fresh fish, such as: ? Anchovies, sardines, herring, and tuna. ? Mussels and scallops. ? Codfish, trout, and haddock.  Bacon.  Organ meats, such as: ? Liver or kidney. ? Tripe. ? Sweetbreads (thymus gland or  pancreas).  Wild game or goose.  Yeast or yeast extract supplements.  Drinks sweetened with high-fructose corn syrup. Summary  Eating a low-purine diet can help control conditions caused by too much uric acid in the body, such as gout or kidney stones.  Choose low-purine foods, limit alcohol, and limit foods high in fat.  You will learn over time which foods do or do not affect you. If you find out that a food tends to cause your gout symptoms to flare up, avoid eating that food. This information is not intended to replace advice given to you by your health care provider. Make sure you discuss any questions you have with your health care provider. Document Revised: 01/25/2017 Document Reviewed: 03/28/2016 Elsevier Patient Education  2020 Elsevier Inc. Gout  Gout is a condition that causes painful swelling of the joints. Gout is a type of inflammation of the joints (arthritis). This condition is caused by having too much uric acid in the body. Uric acid is a chemical that forms when the body breaks down substances called purines. Purines are important for building body proteins. When the body has too much uric acid, sharp crystals can form and build up inside the joints. This causes pain and swelling. Gout attacks can happen quickly and may be very painful (acute gout). Over time, the attacks can affect more joints and become more frequent (chronic gout). Gout can also cause uric acid to build up under the skin and inside the kidneys. What are the causes? This condition is caused by too much uric acid in your blood. This can happen because:  Your kidneys do not remove enough uric acid from your blood. This is the most common cause.  Your body makes too much uric acid. This can happen with some cancers and cancer treatments. It can also occur if your body is breaking down too many red blood cells (hemolytic anemia).  You eat too many foods that are high in purines. These foods include  organ meats and some seafood. Alcohol, especially beer, is also high in purines. A gout attack may be triggered by trauma or stress. What increases the risk? You are more likely to develop this condition if you:  Have a family history of gout.  Are male and middle-aged.  Are male and have gone through menopause.  Are obese.  Frequently drink alcohol, especially beer.  Are dehydrated.  Lose weight too quickly.  Have an organ transplant.  Have lead poisoning.  Take certain medicines, including aspirin, cyclosporine, diuretics, levodopa, and niacin.  Have kidney disease.  Have a skin condition called psoriasis. What are the signs or symptoms? An attack of acute gout happens quickly. It usually occurs in just one joint. The most common place is the big toe. Attacks often start at night. Other joints that may be affected include joints of the feet, ankle, knee, fingers, wrist, or elbow. Symptoms of this condition may include:  Severe pain.    Warmth.  Swelling.  Stiffness.  Tenderness. The affected joint may be very painful to touch.  Shiny, red, or purple skin.  Chills and fever. Chronic gout may cause symptoms more frequently. More joints may be involved. You may also have white or yellow lumps (tophi) on your hands or feet or in other areas near your joints. How is this diagnosed? This condition is diagnosed based on your symptoms, medical history, and physical exam. You may have tests, such as:  Blood tests to measure uric acid levels.  Removal of joint fluid with a thin needle (aspiration) to look for uric acid crystals.  X-rays to look for joint damage. How is this treated? Treatment for this condition has two phases: treating an acute attack and preventing future attacks. Acute gout treatment may include medicines to reduce pain and swelling, including:  NSAIDs.  Steroids. These are strong anti-inflammatory medicines that can be taken by mouth (orally) or  injected into a joint.  Colchicine. This medicine relieves pain and swelling when it is taken soon after an attack. It can be given by mouth or through an IV. Preventive treatment may include:  Daily use of smaller doses of NSAIDs or colchicine.  Use of a medicine that reduces uric acid levels in your blood.  Changes to your diet. You may need to see a dietitian about what to eat and drink to prevent gout. Follow these instructions at home: During a gout attack   If directed, put ice on the affected area: ? Put ice in a plastic bag. ? Place a towel between your skin and the bag. ? Leave the ice on for 20 minutes, 2-3 times a day.  Raise (elevate) the affected joint above the level of your heart as often as possible.  Rest the joint as much as possible. If the affected joint is in your leg, you may be given crutches to use.  Follow instructions from your health care provider about eating or drinking restrictions. Avoiding future gout attacks  Follow a low-purine diet as told by your dietitian or health care provider. Avoid foods and drinks that are high in purines, including liver, kidney, anchovies, asparagus, herring, mushrooms, mussels, and beer.  Maintain a healthy weight or lose weight if you are overweight. If you want to lose weight, talk with your health care provider. It is important that you do not lose weight too quickly.  Start or maintain an exercise program as told by your health care provider. Eating and drinking  Drink enough fluids to keep your urine pale yellow.  If you drink alcohol: ? Limit how much you use to:  0-1 drink a day for women.  0-2 drinks a day for men. ? Be aware of how much alcohol is in your drink. In the [place] one drink equals one 12 oz bottle of beer (355 mL) one 5 oz glass of wine (148 mL), or one 1 oz glass of hard liquor (44 mL). General instructions  Take over-the-counter and prescription medicines only as told by your health care  provider.  Do not drive or use heavy machinery while taking prescription pain medicine.  Return to your normal activities as told by your health care provider. Ask your health care provider what activities are safe for you.  Keep all follow-up visits as told by your health care provider. This is important. Contact a health care provider if you have:  Another gout attack.  Continuing symptoms of a gout attack after 10 days of   treatment.  Side effects from your medicines.  Chills or a fever.  Burning pain when you urinate.  Pain in your lower back or belly. Get help right away if you:  Have severe or uncontrolled pain.  Cannot urinate. Summary  Gout is painful swelling of the joints caused by inflammation.  The most common site of pain is the big toe, but it can affect other joints in the body.  Medicines and dietary changes can help to prevent and treat gout attacks. This information is not intended to replace advice given to you by your health care provider. Make sure you discuss any questions you have with your health care provider. Document Revised: 09/04/2017 Document Reviewed: 09/04/2017 Elsevier Patient Education  2020 Elsevier Inc.  

## 2019-07-24 NOTE — Progress Notes (Signed)
Established patient visit   Patient: Tony Hampton   DOB: 26-Aug-1955   64 y.o. Male  MRN: [id number] Visit Date: 07/24/2019  Today's healthcare provider: Vernie Murders, PA   Chief Complaint  Patient presents with  . Follow-up  . Hypertension   Subjective    HPI   Hypertension, follow-up  BP Readings from Last 3 Encounters:  07/24/19 122/85  12/03/17 110/78  01/03/17 (!) 140/97   Wt Readings from Last 3 Encounters:  07/24/19 (!) 357 lb (161.9 kg)  12/03/17 (!) 348 lb (157.9 kg)  12/20/16 (!) 337 lb (152.9 kg)     He was last seen for hypertension 05/15/3016.  BP at that visit was . Management since that visit includes; currently taking amlodipine and metoprolol. He reports good compliance with treatment. He is not having side effects. none He is not exercising. He is adherent to low salt diet.   Outside blood pressures are not checking.  He does not smoke.  Use of agents associated with hypertension: none.   --------------------------------------------------------------------  Chronic gout of foot, unspecified cause, unspecified laterality From 05/15/2016-Intermittent attacks. Last left foot pain was 05-07-16. Did not have to take the prednisone. Keeps Colchicine handy for flares. Trying to limit purines in diet but not taking the Allopurinol now. Will check labs and consider routine dosage of Allopurinol to prevent attacks.   Past Medical History:  Diagnosis Date  . Hypertension    Past Surgical History:  Procedure Laterality Date  . KNEE ARTHROSCOPY Right   . 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 Marry Guan  . TONSILLECTOMY     Social History   Tobacco Use  . Smoking status: Former Research scientist (life sciences)  . Smokeless tobacco: Never Used  Substance Use Topics  . Alcohol use: Yes    Alcohol/week: 0.0 standard drinks    Comment: OCCASIONALLY  . Drug use: No   Family Status  Relation Name Status  . Mother  (Not Specified)  . Father  (Not Specified)  .  Sister  (Not Specified)   No Known Allergies     Medications: Outpatient Medications Prior to Visit  Medication Sig  . albuterol (PROVENTIL HFA;VENTOLIN HFA) 108 (90 BASE) MCG/ACT inhaler Inhale 2 puffs into the lungs every 6 (six) hours as needed for wheezing or shortness of breath.  . allopurinol (ZYLOPRIM) 100 MG tablet TAKE 1 TABLET BY MOUTH EVERY DAY  . amLODipine (NORVASC) 5 MG tablet TAKE 1 TABLET (5 MG TOTAL) BY MOUTH DAILY.  Marland Kitchen colchicine 0.6 MG tablet TWO TABLETS BY MOUTH AT ONSET OF GOUT FOLLOWED BY 1 TABLET IN 2 HOURS AS NEEDED.  . metoprolol succinate (TOPROL-XL) 50 MG 24 hr tablet TAKE 1 AND 1/2 TABLETS BY MOUTH EVERY DAY  . XARELTO 20 MG TABS tablet   . cephALEXin (KEFLEX) 500 MG capsule TAKE 1 CAPSULE BY MOUTH THREE TIMES A DAY  . doxycycline (VIBRA-TABS) 100 MG tablet Take 1 tablet (100 mg total) by mouth 2 (two) times daily. (Patient not taking: Reported on 07/24/2019)   No facility-administered medications prior to visit.    Review of Systems  Constitutional: Negative for appetite change, chills and fever.  Respiratory: Negative for chest tightness, shortness of breath and wheezing.   Cardiovascular: Negative for chest pain and palpitations.  Gastrointestinal: Negative for abdominal pain, nausea and vomiting.      Objective    BP 122/85 (BP Location: Right Arm, Patient Position: Sitting, Cuff Size: Large)   Pulse 64   Temp Marland Kitchen)  96.9 F (36.1 C) (Other (Comment))   Resp 18   Ht 6' 4"  (1.93 m)   Wt (!) 357 lb (161.9 kg)   SpO2 95%   BMI 43.46 kg/m  Wt Readings from Last 3 Encounters:  07/24/19 (!) 357 lb (161.9 kg)  12/03/17 (!) 348 lb (157.9 kg)  12/20/16 (!) 337 lb (152.9 kg)     Physical Exam Vitals and nursing note reviewed.  Constitutional:      Appearance: Normal appearance. He is normal weight.  Eyes:     Conjunctiva/sclera: Conjunctivae normal.  Cardiovascular:     Rate and Rhythm: Normal rate and regular rhythm.     Pulses: Normal pulses.       Heart sounds: Normal heart sounds.  Pulmonary:     Effort: Pulmonary effort is normal.     Breath sounds: Normal breath sounds.  Abdominal:     General: Bowel sounds are normal.  Musculoskeletal:        General: Normal range of motion.     Cervical back: Normal range of motion and neck supple.  Skin:    General: Skin is warm and dry.  Neurological:     Mental Status: He is alert.  Psychiatric:        Mood and Affect: Mood normal.        Behavior: Behavior normal.        Thought Content: Thought content normal.        Judgment: Judgment normal.       Assessment & Plan     1. Chronic gout of foot, unspecified cause, unspecified laterality Recent flare of gout in the in the right foot that responded well to Colchicine. Still takes Allopurinol 100 mg qd and trying to restrict purines in diet. Will recheck labs and given gout handouts with diet plan. - Uric acid - Sed Rate (ESR) - CBC w/Diff/Platelet - Comprehensive Metabolic Panel (CMET)  2. Essential (primary) hypertension Well controlled with Amlodipine and Metoprolol. Continue to restrict salt intake and recheck labs. - CBC w/Diff/Platelet - Comprehensive Metabolic Panel (CMET) - TSH  3. Atrial fibrillation, unspecified type [facility]) Followed by cardiologist every 6 months (last follow up was 04-01-19 by Hilbert Odor, PA-C/ Dr. Nehemiah Massed). Still on Amlodipine 10 mg qd, Metoprolol Succinate 75 mg qd and Xarelto 20 mg qd. No significant palpitations or chest pains. Recheck CBC, TSH and CMP. - CBC w/Diff/Platelet - Comprehensive Metabolic Panel (CMET) - TSH  4. Nocturia Slowed urine stream and has nocturia 4-5 times a night. Declines DRE today. Will check labs. Suspect BPH. - Comprehensive Metabolic Panel (CMET) - PSA   No follow-ups on file.     Andres Shad, PA, have reviewed all documentation for this visit. The documentation on 07/24/19 for the exam, diagnosis, procedures, and orders are all accurate and  complete.     Vernie Murders, [place] [phone] (phone) [phone] (fax)  [place]

## 2019-07-31 ENCOUNTER — Telehealth: Payer: Self-pay | Admitting: Family Medicine

## 2019-08-01 LAB — CBC WITH DIFFERENTIAL/PLATELET
Absolute Monocytes: 625 cells/uL (ref 200–950)
Basophils Absolute: 21 cells/uL (ref 0–200)
Basophils Relative: 0.4 %
Eosinophils Absolute: 159 cells/uL (ref 15–500)
Eosinophils Relative: 3 %
HCT: 45.7 % (ref 38.5–50.0)
Hemoglobin: 15.4 g/dL (ref 13.2–17.1)
Lymphs Abs: 1039 cells/uL (ref 850–3900)
MCH: 31.8 pg (ref 27.0–33.0)
MCHC: 33.7 g/dL (ref 32.0–36.0)
MCV: 94.2 fL (ref 80.0–100.0)
MPV: 10.9 fL (ref 7.5–12.5)
Monocytes Relative: 11.8 %
Neutro Abs: 3456 cells/uL (ref 1500–7800)
Neutrophils Relative %: 65.2 %
Platelets: 224 10*3/uL (ref 140–400)
RBC: 4.85 10*6/uL (ref 4.20–5.80)
RDW: 12.1 % (ref 11.0–15.0)
Total Lymphocyte: 19.6 %
WBC: 5.3 10*3/uL (ref 3.8–10.8)

## 2019-08-01 LAB — SEDIMENTATION RATE: Sed Rate: 2 mm/h (ref 0–20)

## 2019-08-01 LAB — COMPREHENSIVE METABOLIC PANEL
AG Ratio: 1.8 (calc) (ref 1.0–2.5)
ALT: 15 U/L (ref 9–46)
AST: 13 U/L (ref 10–35)
Albumin: 4.3 g/dL (ref 3.6–5.1)
Alkaline phosphatase (APISO): 51 U/L (ref 35–144)
BUN: 20 mg/dL (ref 7–25)
CO2: 30 mmol/L (ref 20–32)
Calcium: 9.5 mg/dL (ref 8.6–10.3)
Chloride: 104 mmol/L (ref 98–110)
Creat: 0.88 mg/dL (ref 0.70–1.25)
Globulin: 2.4 g/dL (calc) (ref 1.9–3.7)
Glucose, Bld: 95 mg/dL (ref 65–99)
Potassium: 4.3 mmol/L (ref 3.5–5.3)
Sodium: 139 mmol/L (ref 135–146)
Total Bilirubin: 1 mg/dL (ref 0.2–1.2)
Total Protein: 6.7 g/dL (ref 6.1–8.1)

## 2019-08-01 LAB — TSH: TSH: 1.19 mIU/L (ref 0.40–4.50)

## 2019-08-01 LAB — PSA: PSA: 1.3 ng/mL (ref <=4.0)

## 2019-08-01 LAB — URIC ACID: Uric Acid, Serum: 7.8 mg/dL (ref 4.0–8.0)

## 2019-08-03 ENCOUNTER — Telehealth: Payer: Self-pay

## 2019-08-03 NOTE — Telephone Encounter (Signed)
Unable to get call to go through. If patient calls in, [facility] may give results

## 2019-08-03 NOTE — Telephone Encounter (Signed)
Patient called, left VM to return the call to the office for results.  

## 2019-08-03 NOTE — Telephone Encounter (Signed)
-----   Message from Margo Common, [place] sent at 08/03/2019  9:07 AM EDT ----- All blood tests are essentially normal. The uric acid level is upper limits of normal and should try to get this level down to 6 if continuing to have glares of gout. Recommend increase of Allopurinol to 200 mg qd if still on the 100 mg regularly and recheck level in 3 months. Continue purine restricted diet. Inflammatory indicators are normal now after using Colchicine for recent flare.

## 2019-08-06 NOTE — Telephone Encounter (Signed)
Patient's wife-Bonnie called for results: Notified of lab results and PCP recommendations. He will increase dosage and call when needs RF because he will run out sooner. Please put order in for 3 month lab if not entered.

## 2019-11-05 ENCOUNTER — Other Ambulatory Visit: Payer: Self-pay | Admitting: Cardiology

## 2019-11-05 DIAGNOSIS — I712 Thoracic aortic aneurysm, without rupture, unspecified: Secondary | ICD-10-CM

## 2019-11-24 ENCOUNTER — Ambulatory Visit
Admission: RE | Admit: 2019-11-24 | Discharge: 2019-11-24 | Disposition: A | Discharge status: Home / Self Care | Payer: BC Managed Care – PPO | Source: Ambulatory Visit | Attending: Cardiology | Admitting: Cardiology

## 2019-11-24 ENCOUNTER — Other Ambulatory Visit: Payer: Self-pay

## 2019-11-24 DIAGNOSIS — I712 Thoracic aortic aneurysm, without rupture, unspecified: Secondary | ICD-10-CM

## 2019-11-24 LAB — POCT I-STAT CREATININE: Creatinine, Ser: 1.2 mg/dL (ref 0.61–1.24)

## 2019-11-24 MED ORDER — IOHEXOL 350 MG/ML SOLN
75.0000 mL | Freq: Once | INTRAVENOUS | Status: AC | PRN
  Administered 2019-11-24: 75 mL via INTRAVENOUS

## 2020-01-24 ENCOUNTER — Other Ambulatory Visit: Payer: Self-pay | Admitting: Family Medicine

## 2020-01-24 DIAGNOSIS — M1A0790 Idiopathic chronic gout, unspecified ankle and foot, without tophus (tophi): Secondary | ICD-10-CM

## 2020-01-24 NOTE — Telephone Encounter (Signed)
Requested Prescriptions  Pending Prescriptions Disp Refills   allopurinol (ZYLOPRIM) 100 MG tablet [Pharmacy Med Name: ALLOPURINOL 100 MG TABLET] 90 tablet 1    Sig: TAKE 1 TABLET BY MOUTH EVERY DAY     Endocrinology:  Gout Agents Passed - 01/24/2020  9:01 AM      Passed - Uric Acid in normal range and within 360 days    Uric Acid, Serum  Date Value Ref Range Status  07/31/2019 7.8 4.0 - 8.0 mg/dL Final    Comment:    Therapeutic target for gout patients: <6.0 mg/dL .    Uric Acid  Date Value Ref Range Status  02/10/2016 7.4 3.7 - 8.6 mg/dL Final    Comment:               Therapeutic target for gout patients: <6.0         Passed - Cr in normal range and within 360 days    Creat  Date Value Ref Range Status  07/31/2019 0.88 0.70 - 1.25 mg/dL Final    Comment:    For patients >7 years of age, the reference limit for Creatinine is approximately 13% higher for people identified as African-American. .    Creatinine, Ser  Date Value Ref Range Status  11/24/2019 1.20 0.61 - 1.24 mg/dL Final         Passed - Valid encounter within last 12 months    Recent Outpatient Visits          6 months ago Chronic gout of foot, unspecified cause, unspecified laterality   [place], [place]   2 years ago Infected puncture wound of finger, initial encounter   [place], [place]   3 years ago Ingrown toenail of left foot with infection   [facility], [place], [place]   3 years ago Sore throat   [place], [place], [place]   3 years ago Viral URI with cough   [facility] [place], [place], [place]

## 2020-03-08 ENCOUNTER — Telehealth: Payer: Self-pay

## 2020-03-08 NOTE — Telephone Encounter (Signed)
No problems with interactions of his medications with the Zinc or Vitamin-D. Encouraged to consider COVID vaccination in a month or two after the infection clears.

## 2020-03-08 NOTE — Telephone Encounter (Signed)
Copied from [place] [id number]. Topic: Quick Communication - See Telephone Encounter >> Mar 07, 2020  4:59 PM Loma [place] wrote: CRM for notification. See Telephone encounter for: 03/07/20.Pt has is having low grade fever and headache, congestion, wants to make sure that Zinic and Vitamin D-3 would be ok re his meds he takes CB to [place], pt wife  [phone]

## 2020-04-01 ENCOUNTER — Telehealth: Payer: Self-pay | Admitting: Adult Health

## 2020-04-01 ENCOUNTER — Telehealth: Payer: Self-pay

## 2020-04-01 ENCOUNTER — Ambulatory Visit: Payer: Self-pay | Admitting: *Deleted

## 2020-04-01 NOTE — Telephone Encounter (Signed)
Pt called with complaints of blood in his stool that started on 03/28/00; this happened when wipes; he has seen it 3-4 times this week; he denies constipation but he did have an episode of 1 loose stool;  "this week"; recommendations made per nurse triage protocol; the pt sees [facility]; decision tree completed; spoke with [place] regarding scheduling the pt; pt offered and accept MyChart appt 04/01/20 at 1400; he and his wife verbalize understanding;,will route to office for notificatoin of encounter.   Reason for Disposition . Taking Coumadin (warfarin) or other strong blood thinner, or known bleeding disorder (e.g., thrombocytopenia)  Answer Assessment - Initial Assessment Questions 1. APPEARANCE of BLOOD: "What color is it?" "Is it passed separately, on the surface of the stool, or mixed in with the stool?"      Bright red 2. AMOUNT: "How much blood was passed?"  "Little more" than smear on the tissue per pt; his wife says "it looks like a period" 3. FREQUENCY: "How many times has blood been passed with the stools?"      4 times 4. ONSET: "When was the blood first seen in the stools?" (Days or weeks)      03/28/20 5. DIARRHEA: "Is there also some diarrhea?" If Yes, ask: "How many diarrhea stools were passed in past 24 hours?"      no 6. CONSTIPATION: "Do you have constipation?" If Yes, ask: "How bad is it?"     no 7. RECURRENT SYMPTOMS: "Have you had blood in your stools before?" If Yes, ask: "When was the last time?" and "What happened that time?"      Has occurred over the past 10 years 8. BLOOD THINNERS: "Do you take any blood thinners?" (e.g., Coumadin/warfarin, Pradaxa/dabigatran, aspirin)     Xarelto 9. OTHER SYMPTOMS: "Do you have any other symptoms?"  (e.g., abdominal pain, vomiting, dizziness, fever)     No; pt recently COVID 03/07/20 10. PREGNANCY: "Is there any chance you are pregnant?" "When was your last menstrual period?"    n/a  Protocols used:  RECTAL BLEEDING-A-AH

## 2020-04-01 NOTE — Telephone Encounter (Signed)
Spoke with wife I did not see report scanned into chart. I told wife I see a conversion in there dated 12/15/2003 from Dr. Tiffany Kocher marked endoscopy but there is no documentation or anything loaded. Wife states that it sounds about the time frame when patient had colonoscopy. She states that patient has been dealing with hemorrhoids and rectal bleeding for years and states that she believes he is suffereing with hemrrhoids. As discussed with you earlier from triage message but stated that he noticed blood when wiping on 3 occasions but denied it being a gross amount. Patient was scheduled for virtual I advised him it would have to be an appointment in person, he requested appt on Wednesday. KW

## 2020-04-01 NOTE — Telephone Encounter (Signed)
Noted. Thanks.

## 2020-04-01 NOTE — Telephone Encounter (Signed)
Pt has been rescheduled to 04/06/20.

## 2020-04-01 NOTE — Telephone Encounter (Signed)
Copied from [place] [id number]. Topic: General - Other >> Apr 01, 2020 11:44 AM Celene Kras wrote: Reason for CRM: Pts wife called and is requesting to have info regarding the pts last colonoscopy. She states that she is needing to have the date and there is no info on chart. Please advise.

## 2020-04-05 NOTE — Progress Notes (Deleted)
      Established patient visit   Patient: Tony Hampton   DOB: 10-05-1955   65 y.o. Male  MRN: [id number] Visit Date: 04/06/2020  Today's healthcare provider: Marcille Buffy, FNP   No chief complaint on file.  Subjective    Rectal Bleeding  The current episode started 5 to 7 days ago. The onset was sudden. The problem occurs occasionally. The stool is described as mixed with blood. There was no prior successful therapy. Associated symptoms include hemorrhoids and rectal pain. Pertinent negatives include no anorexia, no fever, no abdominal pain, no diarrhea, no hematemesis, no nausea, no vomiting, no hematuria, no vaginal bleeding, no vaginal discharge, no chest pain, no headaches, no coughing, no difficulty breathing and no rash.    ***  {Show patient history (optional):23778::" "}   Medications: Outpatient Medications Prior to Visit  Medication Sig  . allopurinol (ZYLOPRIM) 100 MG tablet TAKE 1 TABLET BY MOUTH EVERY DAY  . amLODipine (NORVASC) 5 MG tablet TAKE 1 TABLET (5 MG TOTAL) BY MOUTH DAILY.  Marland Kitchen colchicine 0.6 MG tablet TWO TABLETS BY MOUTH AT ONSET OF GOUT FOLLOWED BY 1 TABLET IN 2 HOURS AS NEEDED.  . metoprolol succinate (TOPROL-XL) 50 MG 24 hr tablet TAKE 1 AND 1/2 TABLETS BY MOUTH EVERY DAY  . XARELTO 20 MG TABS tablet    No facility-administered medications prior to visit.    Review of Systems  Constitutional: Negative for fever.  Respiratory: Negative for cough.   Cardiovascular: Negative for chest pain.  Gastrointestinal: Positive for hematochezia, hemorrhoids and rectal pain. Negative for abdominal pain, anorexia, diarrhea, hematemesis, nausea and vomiting.  Genitourinary: Negative for hematuria, vaginal bleeding and vaginal discharge.  Skin: Negative for rash.  Neurological: Negative for headaches.    {Labs  Heme  Chem  Endocrine  Serology  Results Review (optional):23779::" "}   Objective    There were no vitals taken for this visit. {Show  previous vital signs (optional):23777::" "}   Physical Exam  ***  No results found for any visits on 04/06/20.  Assessment & Plan     ***  No follow-ups on file.      {provider attestation***:1}   Marcille Buffy, [place] [phone] (phone) [phone] (fax)  [place]

## 2020-04-06 ENCOUNTER — Ambulatory Visit: Payer: Self-pay | Admitting: Adult Health

## 2020-07-24 ENCOUNTER — Other Ambulatory Visit: Payer: Self-pay | Admitting: Family Medicine

## 2020-07-24 DIAGNOSIS — M1A0790 Idiopathic chronic gout, unspecified ankle and foot, without tophus (tophi): Secondary | ICD-10-CM

## 2020-07-24 NOTE — Telephone Encounter (Signed)
Requested Prescriptions  Pending Prescriptions Disp Refills  . allopurinol (ZYLOPRIM) 100 MG tablet [Pharmacy Med Name: ALLOPURINOL 100 MG TABLET] 90 tablet 0    Sig: TAKE 1 TABLET BY MOUTH EVERY DAY     Endocrinology:  Gout Agents Failed - 07/24/2020  9:31 AM      Failed - Valid encounter within last 12 months    Recent Outpatient Visits          1 year ago Chronic gout of foot, unspecified cause, unspecified laterality   [place], Vickki Muff, PA-C   2 years ago Infected puncture wound of finger, initial encounter   [facility], Vickki Muff, PA-C   3 years ago Ingrown toenail of left foot with infection   [facility], Vickki Muff, PA-C   3 years ago Sore throat   [facility] Carles Collet M, [place]   4 years ago Viral URI with cough   [facility] [place], Adriana M, [place]             Passed - Uric Acid in normal range and within 360 days    Uric Acid, Serum  Date Value Ref Range Status  07/31/2019 7.8 4.0 - 8.0 mg/dL Final    Comment:    Therapeutic target for gout patients: <6.0 mg/dL .    Uric Acid  Date Value Ref Range Status  02/10/2016 7.4 3.7 - 8.6 mg/dL Final    Comment:               Therapeutic target for gout patients: <6.0         Passed - Cr in normal range and within 360 days    Creat  Date Value Ref Range Status  07/31/2019 0.88 0.70 - 1.25 mg/dL Final    Comment:    For patients >51 years of age, the reference limit for Creatinine is approximately 13% higher for people identified as African-American. .    Creatinine, Ser  Date Value Ref Range Status  11/24/2019 1.20 0.61 - 1.24 mg/dL Final

## 2020-07-27 ENCOUNTER — Other Ambulatory Visit: Payer: Self-pay | Admitting: *Deleted

## 2020-07-27 ENCOUNTER — Other Ambulatory Visit: Payer: Self-pay | Admitting: Family Medicine

## 2020-07-27 DIAGNOSIS — M1A0790 Idiopathic chronic gout, unspecified ankle and foot, without tophus (tophi): Secondary | ICD-10-CM

## 2020-07-27 MED ORDER — COLCHICINE 0.6 MG PO TABS: ORAL_TABLET | ORAL | 0 refills | Status: DC

## 2020-07-27 NOTE — Telephone Encounter (Signed)
Pt's wife calling, pt present. After hours call. Requesting refill of Colchicine. States "Really bad gout flair up." States has 1 tab left, states last refill he picked up was 1 month ago "And pharmacy said he had no refills left." Pt also needed appt, Appt scheduled for pt for 08/15/20 and med refilled until appt date.

## 2020-07-27 NOTE — Telephone Encounter (Signed)
Requested medication (s) are due for refill today: no  Requested medication (s) are on the active medication list: yes  Last refill:  05/14/2020  Future visit scheduled: no  Notes to clinic:  overdue for follow up  Message has been sent to patient to contact office    Requested Prescriptions  Pending Prescriptions Disp Refills   colchicine 0.6 MG tablet [Pharmacy Med Name: COLCHICINE 0.6 MG TABLET] 20 tablet 4    Sig: TWO TABLETS BY MOUTH AT ONSET OF GOUT FOLLOWED BY 1 TABLET IN 2 HOURS AS NEEDED.      Endocrinology:  Gout Agents Failed - 07/27/2020  9:17 AM      Failed - Uric Acid in normal range and within 360 days    Uric Acid, Serum  Date Value Ref Range Status  07/31/2019 7.8 4.0 - 8.0 mg/dL Final    Comment:    Therapeutic target for gout patients: <6.0 mg/dL .    Uric Acid  Date Value Ref Range Status  02/10/2016 7.4 3.7 - 8.6 mg/dL Final    Comment:               Therapeutic target for gout patients: <6.0          Failed - Valid encounter within last 12 months    Recent Outpatient Visits           1 year ago Chronic gout of foot, unspecified cause, unspecified laterality   [place], Vickki Muff, PA-C   2 years ago Infected puncture wound of finger, initial encounter   [facility], Vickki Muff, PA-C   3 years ago Ingrown toenail of left foot with infection   [facility], Vickki Muff, PA-C   3 years ago Sore throat   [facility] Carles Collet M, [place]   4 years ago Viral URI with cough   [facility] Terrilee Croak, Adriana M, [place]                Passed - Cr in normal range and within 360 days    Creat  Date Value Ref Range Status  07/31/2019 0.88 0.70 - 1.25 mg/dL Final    Comment:    For patients >35 years of age, the reference limit for Creatinine is approximately 13% higher for people identified as African-American. .    Creatinine, Ser  Date Value Ref  Range Status  11/24/2019 1.20 0.61 - 1.24 mg/dL Final            allopurinol (ZYLOPRIM) 100 MG tablet [Pharmacy Med Name: ALLOPURINOL 100 MG TABLET] 90 tablet 0    Sig: TAKE 1 TABLET BY MOUTH EVERY DAY      Endocrinology:  Gout Agents Failed - 07/27/2020  9:17 AM      Failed - Uric Acid in normal range and within 360 days    Uric Acid, Serum  Date Value Ref Range Status  07/31/2019 7.8 4.0 - 8.0 mg/dL Final    Comment:    Therapeutic target for gout patients: <6.0 mg/dL .    Uric Acid  Date Value Ref Range Status  02/10/2016 7.4 3.7 - 8.6 mg/dL Final    Comment:               Therapeutic target for gout patients: <6.0          Failed - Valid encounter within last 12 months    Recent Outpatient Visits  1 year ago Chronic gout of foot, unspecified cause, unspecified laterality   [place], Vickki Muff, PA-C   2 years ago Infected puncture wound of finger, initial encounter   [facility], Vickki Muff, PA-C   3 years ago Ingrown toenail of left foot with infection   [facility], Vickki Muff, [place]   3 years ago Sore throat   [facility] Carles Collet M, [place]   4 years ago Viral URI with cough   [facility] Terrilee Croak, Adriana M, [place]                Passed - Cr in normal range and within 360 days    Creat  Date Value Ref Range Status  07/31/2019 0.88 0.70 - 1.25 mg/dL Final    Comment:    For patients >41 years of age, the reference limit for Creatinine is approximately 13% higher for people identified as African-American. .    Creatinine, Ser  Date Value Ref Range Status  11/24/2019 1.20 0.61 - 1.24 mg/dL Final

## 2020-07-28 NOTE — Addendum Note (Signed)
Addended by: Ashley Royalty E on: 07/28/2020 08:31 AM   Modules accepted: Orders

## 2020-08-15 ENCOUNTER — Encounter: Payer: Self-pay | Admitting: Family Medicine

## 2020-08-15 ENCOUNTER — Other Ambulatory Visit: Payer: Self-pay

## 2020-08-15 ENCOUNTER — Ambulatory Visit: Payer: BC Managed Care – PPO | Admitting: Family Medicine

## 2020-08-15 VITALS — BP 133/80 | HR 83 | Temp 97.7°F | Wt 374.0 lb

## 2020-08-15 DIAGNOSIS — G4733 Obstructive sleep apnea (adult) (pediatric): Secondary | ICD-10-CM | POA: Diagnosis not present

## 2020-08-15 DIAGNOSIS — M1A0790 Idiopathic chronic gout, unspecified ankle and foot, without tophus (tophi): Secondary | ICD-10-CM

## 2020-08-15 DIAGNOSIS — I4891 Unspecified atrial fibrillation: Secondary | ICD-10-CM

## 2020-08-15 DIAGNOSIS — I10 Essential (primary) hypertension: Secondary | ICD-10-CM

## 2020-08-15 NOTE — Progress Notes (Signed)
Established patient visit   Patient: Tony Hampton   DOB: 1955/05/07   65 y.o. Male  MRN: [id number] Visit Date: 08/15/2020  Today's healthcare provider: Vernie Murders, PA-C   No chief complaint on file.  Subjective    HPI  Hypertension, follow-up  BP Readings from Last 3 Encounters:  08/15/20 133/80  07/24/19 122/85  12/03/17 110/78   Wt Readings from Last 3 Encounters:  08/15/20 (!) 374 lb (169.6 kg)  07/24/19 (!) 357 lb (161.9 kg)  12/03/17 (!) 348 lb (157.9 kg)     He was last seen for hypertension 11 months ago.  BP at that visit was as above. Management since that visit includes none.  He reports good compliance with treatment. He is not having side effects.  He is following a Regular diet. He is not exercising. He does not smoke.  Use of agents associated with hypertension: none.   Outside blood pressures are not being checked. Symptoms: No chest pain No chest pressure  No palpitations No syncope  No dyspnea No orthopnea  No paroxysmal nocturnal dyspnea No lower extremity edema   Pertinent labs: Lab Results  Component Value Date   CHOL 151 01/29/2017   HDL 41 01/29/2017   LDLCALC 83 01/29/2017   TRIG 169 (H) 01/29/2017   CHOLHDL 3.7 01/29/2017   Lab Results  Component Value Date   NA 139 07/31/2019   K 4.3 07/31/2019   CREATININE 1.20 11/24/2019   GFRNONAA 80 01/29/2017   GFRAA 92 01/29/2017   GLUCOSE 95 07/31/2019     The ASCVD Risk score (Goff DC Jr., et al., 2013) failed to calculate for the following reasons:   Cannot find a previous HDL lab   Cannot find a previous total cholesterol lab   --------------------------------------------------------------------------------------------------- Past Medical History:  Diagnosis Date   Hypertension    Past Surgical History:  Procedure Laterality Date   KNEE ARTHROSCOPY Right    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 Marry Guan   TONSILLECTOMY     Family History  Problem  Relation Age of Onset   Asthma Mother    Hypertension Mother    Hypertension Father    Diabetes Father    Breast cancer Sister    Social History   Socioeconomic History   Marital status: Married    Spouse name: Not on file   Number of children: Not on file   Years of education: Not on file   Highest education level: Not on file  Occupational History   Not on file  Tobacco Use   Smoking status: Former    Pack years: 0.00   Smokeless tobacco: Never  Substance and Sexual Activity   Alcohol use: Yes    Alcohol/week: 0.0 standard drinks    Comment: OCCASIONALLY   Drug use: No   Sexual activity: Not on file  Other Topics Concern   Not on file  Social History Narrative   Not on file   Social Determinants of Health   Financial Resource Strain: Not on file  Food Insecurity: Not on file  Transportation Needs: Not on file  Physical Activity: Not on file  Stress: Not on file  Social Connections: Not on file  Intimate Partner Violence: Not on file   No Known Allergies  Medications: Outpatient Medications Prior to Visit  Medication Sig   allopurinol (ZYLOPRIM) 100 MG tablet TAKE 1 TABLET BY MOUTH EVERY DAY   amLODipine (NORVASC) 5 MG tablet TAKE 1  TABLET (5 MG TOTAL) BY MOUTH DAILY.   colchicine 0.6 MG tablet TWO TABLETS BY MOUTH AT ONSET OF GOUT FOLLOWED BY 1 TABLET IN 2 HOURS AS NEEDED.   metoprolol succinate (TOPROL-XL) 50 MG 24 hr tablet TAKE 1 AND 1/2 TABLETS BY MOUTH EVERY DAY   XARELTO 20 MG TABS tablet    No facility-administered medications prior to visit.    Review of Systems  Constitutional: Negative.   HENT: Negative.    Respiratory: Negative.    Cardiovascular: Negative.   Gastrointestinal: Negative.   Musculoskeletal:  Positive for arthralgias.       Occasional attacks of gout.       Objective    BP 133/80 (BP Location: Right Arm, Patient Position: Sitting, Cuff Size: Large)   Pulse 83   Temp 97.7 F (36.5 C) (Oral)   Wt (!) 374 lb (169.6 kg)    SpO2 95%   BMI 45.52 kg/m     Physical Exam Constitutional:      General: He is not in acute distress.    Appearance: He is well-developed.  HENT:     Head: Normocephalic and atraumatic.     Right Ear: Hearing normal.     Left Ear: Hearing normal.     Nose: Nose normal.  Eyes:     General: Lids are normal. No scleral icterus.       Right eye: No discharge.        Left eye: No discharge.     Conjunctiva/sclera: Conjunctivae normal.  Cardiovascular:     Rate and Rhythm: Normal rate and regular rhythm.     Heart sounds: Normal heart sounds.  Pulmonary:     Effort: Pulmonary effort is normal. No respiratory distress.  Musculoskeletal:        General: Normal range of motion.     Cervical back: Neck supple.  Skin:    Findings: No lesion or rash.  Neurological:     Mental Status: He is alert and oriented to person, place, and time.  Psychiatric:        Speech: Speech normal.        Behavior: Behavior normal.        Thought Content: Thought content normal.      No results found for any visits on 08/15/20.  Assessment & Plan     1. Essential (primary) hypertension Well controlled with Amlodipine and Metoprolol. Needs to lose weight. Recheck labs. - Comprehensive metabolic panel - CBC with Differential/Platelet - TSH - Lipid Profile  2. Atrial fibrillation, unspecified type (HCC) Stable on the Xarelto and Metoprolol. Follow up with cardiologist and recheck labs. - Comprehensive metabolic panel - CBC with Differential/Platelet - TSH - Lipid Profile  3. Chronic gout of foot, unspecified cause, unspecified laterality Tolerating Allopurinol. Last attack in the left foot was 2 weeks ago. Will recheck labs. - Comprehensive metabolic panel - CBC with Differential/Platelet - Uric acid  4. Obstructive sleep apnea syndrome Having difficulty getting CPAP machine with [employer]. Will check with [facility]. - Comprehensive metabolic panel - CBC with  Differential/Platelet   No follow-ups on file.      I, Lea Walbert, PA-C, have reviewed all documentation for this visit. The documentation on 08/15/20 for the exam, diagnosis, procedures, and orders are all accurate and complete.    Vernie Murders, PA-C  [employer] [phone] (phone) [phone] (fax)  [place]

## 2020-09-02 LAB — CBC WITH DIFFERENTIAL/PLATELET
Absolute Monocytes: 621 cells/uL (ref 200–950)
Basophils Absolute: 32 cells/uL (ref 0–200)
Basophils Relative: 0.6 %
Eosinophils Absolute: 178 cells/uL (ref 15–500)
Eosinophils Relative: 3.3 %
HCT: 46.7 % (ref 38.5–50.0)
Hemoglobin: 15.4 g/dL (ref 13.2–17.1)
Lymphs Abs: 1118 cells/uL (ref 850–3900)
MCH: 30.4 pg (ref 27.0–33.0)
MCHC: 33 g/dL (ref 32.0–36.0)
MCV: 92.1 fL (ref 80.0–100.0)
MPV: 11.1 fL (ref 7.5–12.5)
Monocytes Relative: 11.5 %
Neutro Abs: 3451 cells/uL (ref 1500–7800)
Neutrophils Relative %: 63.9 %
Platelets: 224 10*3/uL (ref 140–400)
RBC: 5.07 10*6/uL (ref 4.20–5.80)
RDW: 12.1 % (ref 11.0–15.0)
Total Lymphocyte: 20.7 %
WBC: 5.4 10*3/uL (ref 3.8–10.8)

## 2020-09-02 LAB — COMPREHENSIVE METABOLIC PANEL
AG Ratio: 1.6 (calc) (ref 1.0–2.5)
ALT: 12 U/L (ref 9–46)
AST: 11 U/L (ref 10–35)
Albumin: 4.2 g/dL (ref 3.6–5.1)
Alkaline phosphatase (APISO): 61 U/L (ref 35–144)
BUN: 14 mg/dL (ref 7–25)
CO2: 28 mmol/L (ref 20–32)
Calcium: 9.6 mg/dL (ref 8.6–10.3)
Chloride: 103 mmol/L (ref 98–110)
Creat: 0.9 mg/dL (ref 0.70–1.25)
Globulin: 2.6 g/dL (calc) (ref 1.9–3.7)
Glucose, Bld: 98 mg/dL (ref 65–99)
Potassium: 4.5 mmol/L (ref 3.5–5.3)
Sodium: 139 mmol/L (ref 135–146)
Total Bilirubin: 0.9 mg/dL (ref 0.2–1.2)
Total Protein: 6.8 g/dL (ref 6.1–8.1)

## 2020-09-02 LAB — LIPID PANEL
Cholesterol: 150 mg/dL (ref <200)
HDL: 40 mg/dL (ref >=40)
LDL Cholesterol (Calc): 87 mg/dL (calc)
Non-HDL Cholesterol (Calc): 110 mg/dL (calc) (ref <130)
Total CHOL/HDL Ratio: 3.8 (calc) (ref <5.0)
Triglycerides: 131 mg/dL (ref <150)

## 2020-09-02 LAB — URIC ACID: Uric Acid, Serum: 8.9 mg/dL — ABNORMAL HIGH (ref 4.0–8.0)

## 2020-09-02 LAB — TSH: TSH: 1.47 mIU/L (ref 0.40–4.50)

## 2020-09-23 ENCOUNTER — Telehealth: Payer: Self-pay | Admitting: Family Medicine

## 2020-09-23 ENCOUNTER — Other Ambulatory Visit: Payer: Self-pay

## 2020-09-23 DIAGNOSIS — M1A0790 Idiopathic chronic gout, unspecified ankle and foot, without tophus (tophi): Secondary | ICD-10-CM

## 2020-09-23 MED ORDER — ALLOPURINOL 100 MG PO TABS: 100.0000 mg | ORAL_TABLET | Freq: Two times a day (BID) | ORAL | 0 refills | Status: DC

## 2020-09-23 NOTE — Progress Notes (Signed)
Allopurinol 100 mg increased to BID and follow up in 3 months

## 2020-09-23 NOTE — Telephone Encounter (Signed)
Pt is calling for his latest lab results please advise CB- [phone]

## 2020-10-26 ENCOUNTER — Other Ambulatory Visit: Payer: Self-pay | Admitting: Family Medicine

## 2020-11-24 ENCOUNTER — Ambulatory Visit: Payer: Self-pay | Admitting: Family Medicine

## 2020-12-08 ENCOUNTER — Other Ambulatory Visit: Payer: Self-pay

## 2020-12-08 ENCOUNTER — Telehealth: Payer: Self-pay | Admitting: Family Medicine

## 2020-12-08 DIAGNOSIS — M1A0790 Idiopathic chronic gout, unspecified ankle and foot, without tophus (tophi): Secondary | ICD-10-CM

## 2020-12-08 MED ORDER — ALLOPURINOL 100 MG PO TABS: 100.0000 mg | ORAL_TABLET | Freq: Two times a day (BID) | ORAL | 1 refills | Status: DC

## 2020-12-08 NOTE — Telephone Encounter (Signed)
[facility] faxed refill request for the following medications:  allopurinol (ZYLOPRIM) 100 MG tablet  Last Rx: 09/23/20 Qty: 180    Refills: 0 LOV: 08/15/20 with Simona Huh NOV: 12/13/20 with Daneil Dan Please advise. Thanks TNP

## 2020-12-13 ENCOUNTER — Ambulatory Visit: Payer: BC Managed Care – PPO | Admitting: Family Medicine

## 2020-12-20 ENCOUNTER — Ambulatory Visit: Payer: BC Managed Care – PPO | Admitting: Family Medicine

## 2021-03-16 ENCOUNTER — Other Ambulatory Visit: Payer: Self-pay

## 2021-03-16 ENCOUNTER — Encounter: Payer: Self-pay | Admitting: Family Medicine

## 2021-03-16 ENCOUNTER — Telehealth (INDEPENDENT_AMBULATORY_CARE_PROVIDER_SITE_OTHER): Payer: BC Managed Care – PPO | Admitting: Family Medicine

## 2021-03-16 DIAGNOSIS — J9801 Acute bronchospasm: Secondary | ICD-10-CM | POA: Diagnosis not present

## 2021-03-16 DIAGNOSIS — J3489 Other specified disorders of nose and nasal sinuses: Secondary | ICD-10-CM

## 2021-03-16 DIAGNOSIS — J029 Acute pharyngitis, unspecified: Secondary | ICD-10-CM

## 2021-03-16 NOTE — Assessment & Plan Note (Signed)
7 days at time of visit Improving Productive Report of thick light yellow sputum No foul smell No gagging, no choking Reassurance provided Continue OTC agents Ex mucinex DM, delsym

## 2021-03-16 NOTE — Assessment & Plan Note (Signed)
Likely d/t rhinorrhea Encourage hydration Use of oral lozenges to keep mouth moist

## 2021-03-16 NOTE — Assessment & Plan Note (Signed)
Likely viral or sinus related Denies sinus pain or pressure Denies hx of allergies

## 2021-03-16 NOTE — Progress Notes (Signed)
Virtual telephone visit    Virtual Visit via Telephone Note   This visit type was conducted due to national recommendations for restrictions regarding the COVID-19 Pandemic (e.g. social distancing) in an effort to limit this patient's exposure and mitigate transmission in our community. Due to his co-morbid illnesses, this patient is at least at moderate risk for complications without adequate follow up. This format is felt to be most appropriate for this patient at this time. The patient did not have access to video technology or had technical difficulties with video requiring transitioning to audio format only (telephone). Physical exam was limited to content and character of the telephone converstion.    Patient location: home, confirmed with two identifiers Provider location: [facility]  I discussed the limitations of evaluation and management by telemedicine and the availability of in person appointments. The patient expressed understanding and agreed to proceed.   Visit Date: 03/16/2021  Today's healthcare provider: Gwyneth Sprout, FNP   Chief Complaint  Patient presents with   Cough   Subjective    Cough This is a new problem. The current episode started 1 to 4 weeks ago. The problem has been gradually improving. The cough is Productive of sputum. Associated symptoms include postnasal drip, rhinorrhea and a sore throat. Pertinent negatives include no chest pain, chills, ear congestion, ear pain, fever, headaches, heartburn, hemoptysis, myalgias, nasal congestion, rash, shortness of breath, sweats, weight loss or wheezing. Treatments tried: Cold and Cough HBP. The treatment provided mild relief. There is no history of asthma, bronchitis, COPD, emphysema, environmental allergies or pneumonia.       Medications: Outpatient Medications Prior to Visit  Medication Sig   allopurinol (ZYLOPRIM) 100 MG tablet Take 1 tablet (100 mg total) by mouth 2 (two) times daily.   amLODipine  (NORVASC) 10 MG tablet Take 10 mg by mouth daily.   colchicine 0.6 MG tablet TAKE TWO TABLETS BY MOUTH AT ONSET OF GOUT FOLLOWED BY 1 TABLET IN 2 HOURS AS NEEDED.   metoprolol succinate (TOPROL-XL) 50 MG 24 hr tablet TAKE 1 AND 1/2 TABLETS BY MOUTH EVERY DAY   XARELTO 20 MG TABS tablet    [DISCONTINUED] amLODipine (NORVASC) 5 MG tablet TAKE 1 TABLET (5 MG TOTAL) BY MOUTH DAILY.   No facility-administered medications prior to visit.    Review of Systems  Constitutional:  Negative for chills, fever and weight loss.  HENT:  Positive for postnasal drip, rhinorrhea and sore throat. Negative for ear pain.   Respiratory:  Positive for cough. Negative for hemoptysis, shortness of breath and wheezing.   Cardiovascular:  Negative for chest pain.  Gastrointestinal:  Negative for heartburn.  Musculoskeletal:  Negative for myalgias.  Skin:  Negative for rash.  Allergic/Immunologic: Negative for environmental allergies.  Neurological:  Negative for headaches.     Objective    There were no vitals taken for this visit.     Assessment & Plan     Problem List Items Addressed This Visit       Other   Cough due to bronchospasm - Primary    7 days at time of visit Improving Productive Report of thick light yellow sputum No foul smell No gagging, no choking Reassurance provided Continue OTC agents Ex mucinex DM, delsym       Rhinorrhea    Likely viral or sinus related Denies sinus pain or pressure Denies hx of allergies       Sore throat    Likely d/t rhinorrhea Encourage hydration  Use of oral lozenges to keep mouth moist         Return if symptoms worsen or fail to improve.    I discussed the assessment and treatment plan with the patient. The patient was provided an opportunity to ask questions and all were answered. The patient agreed with the plan and demonstrated an understanding of the instructions.   The patient was advised to call back or seek an in-person  evaluation if the symptoms worsen or if the condition fails to improve as anticipated.  I provided 8 minutes of non-face-to-face time during this encounter.  Vonna Kotyk, FNP, have reviewed all documentation for this visit. The documentation on 03/16/21 for the exam, diagnosis, procedures, and orders are all accurate and complete.  Patient seen and examined by Tally Joe,  FNP note scribed by [employer], [place], [place] [phone] (phone) [phone] (fax)  [place]

## 2021-05-03 ENCOUNTER — Ambulatory Visit (INDEPENDENT_AMBULATORY_CARE_PROVIDER_SITE_OTHER): Payer: BC Managed Care – PPO

## 2021-05-03 ENCOUNTER — Other Ambulatory Visit: Payer: Self-pay

## 2021-05-03 ENCOUNTER — Ambulatory Visit: Payer: BC Managed Care – PPO | Admitting: Podiatry

## 2021-05-03 VITALS — BP 127/72 | HR 60 | Resp 20

## 2021-05-03 DIAGNOSIS — M7751 Other enthesopathy of right foot: Secondary | ICD-10-CM

## 2021-05-03 DIAGNOSIS — M10471 Other secondary gout, right ankle and foot: Secondary | ICD-10-CM

## 2021-05-03 DIAGNOSIS — M79671 Pain in right foot: Secondary | ICD-10-CM

## 2021-05-03 NOTE — Progress Notes (Signed)
?  Subjective:  ?Patient ID: Tony Hampton, male    DOB: 18-Dec-1955,  MRN: [id number] ? ?Chief Complaint  ?Patient presents with  ? Foot Pain  ?  "I don't know if it's Gout or not."  ? ? ?65 y.o. male presents with the above complaint. History confirmed with patient.  He may be having a gout flare that began around the time of the Super Bowl he had types of shellfish such as shrimp and scallops of the time.  Is been red and swollen and hot since then.  He does take allopurinol chronically.  Has a prescription for colchicine at home but has not taken it. ? ?Objective:  ?Physical Exam: ?warm, good capillary refill, no trophic changes or ulcerative lesions, normal DP and PT pulses, normal sensory exam, and painful erythematous first MTPJ on the right with limited range of motion that is very swollen. ? ?Radiographs: ?Multiple views x-ray of the right foot: So far no appear articular or intra-articular erosions or joint space narrowing is noted ?Assessment:  ? ?1. Acute gout due to other secondary cause involving toe of right foot   ? ? ? ?Plan:  ?Patient was evaluated and treated and all questions answered. ? ?I discussed with him I do think this likely is an acute gout flare on top of his chronic gout.  We discussed management of this including dietary restrictions and use of uric acid lowering agents.  I discussed with him he should talk to his PCP about changing or increasing his allopurinol dosage.  For the acute flare I recommended colchicine and that he take 2 tablets then 1 tablet 1 hour after and then 1 tablet daily for 1 week.  He will let me know if he needs a refill of this currently.  I recommended new lab work including a BMP and uric acid to evaluate for any renal insufficiency and increasing uric acid levels.  Finally we also discussed intra-articular injection and aspiration.  He agreed to this and following a local anesthetic block and Mayo fashion around the first ray and Betadine prep I attempted to  aspirate the first MTPJ which was not possible however was able to inject 10 mg of Kenalog directly into the joint with 0.5 cc of lidocaine 2% and 2 mg of Kenalog.  He tolerated the procedure well.  He will let me know he was doing and follow-up as needed. ? ?No follow-ups on file.  ? ?

## 2021-05-15 IMAGING — CT CT ANGIO CHEST
3 of 6 series · 18 of 46 positions shown · IV contrast (APPLIED)
Comparison: None available

CLINICAL DATA: Thoracic aortic aneurysm seen on ECHO, currently
asymptomatic

EXAM:
CT ANGIOGRAPHY CHEST WITH CONTRAST
TECHNIQUE: Multidetector CT imaging of the chest was performed using the
standard protocol during bolus administration of intravenous
contrast. Multiplanar CT image reconstructions and MIPs were
obtained to evaluate the vascular anatomy.
CONTRAST:  75mL OMNIPAQUE IOHEXOL 350 MG/ML SOLN

[Series 4: axial arterial · axial · arterial · 0.78mm/px · z∈[-69,+207]mm · 11 of 112 slices shown]
[im 10/112  lung]
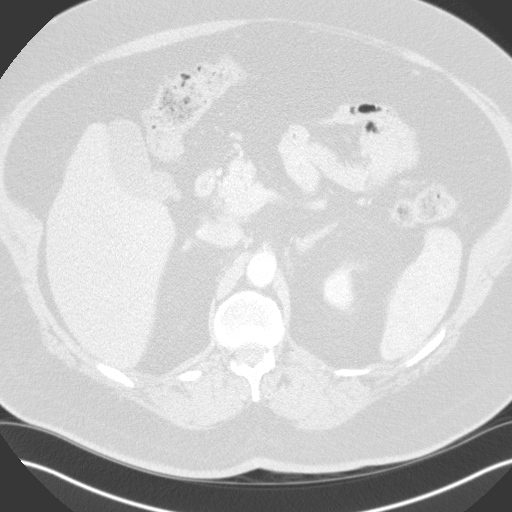
[im 19/112  soft-tissue]
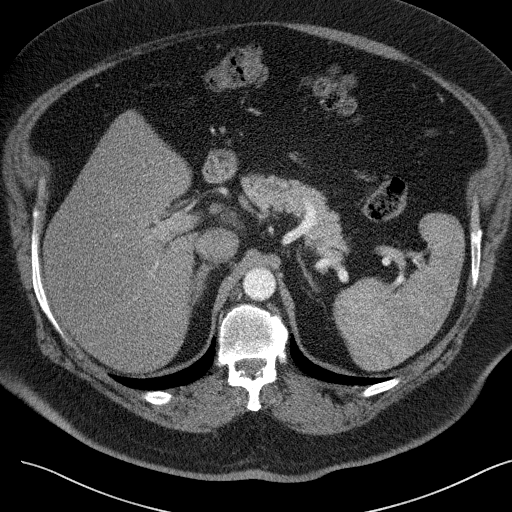
[im 28/112  lung]
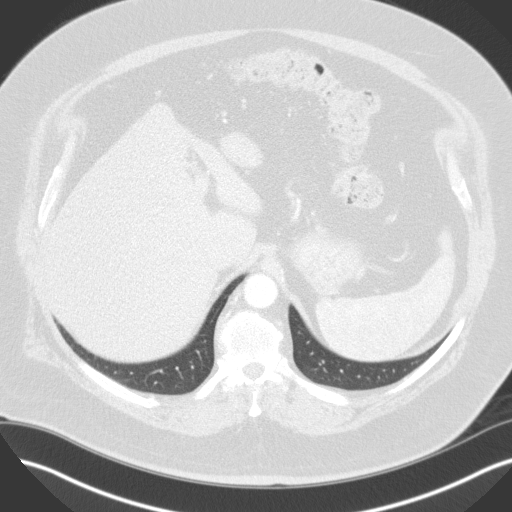
[im 38/112  soft-tissue]
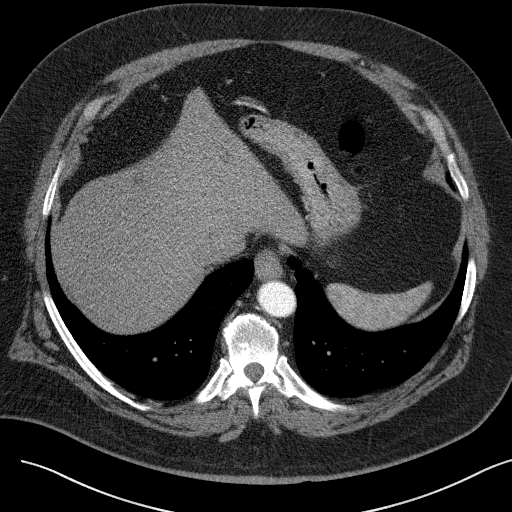
[im 47/112  lung]
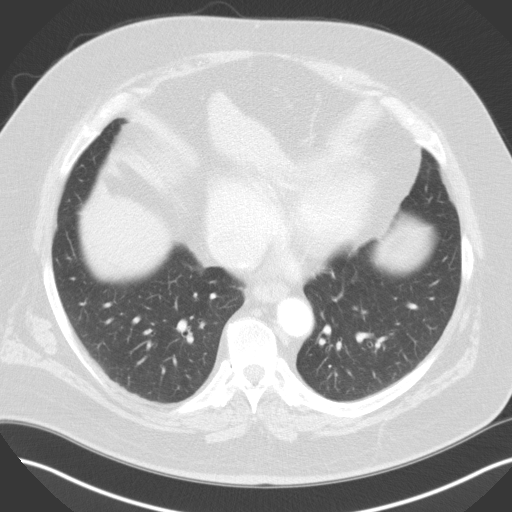
[im 56/112  soft-tissue]
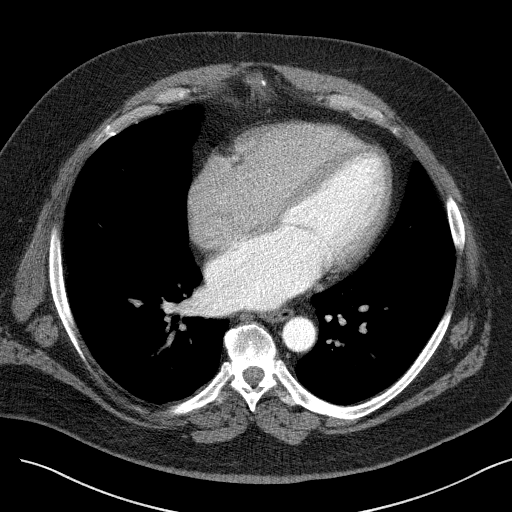
[im 65/112  lung]
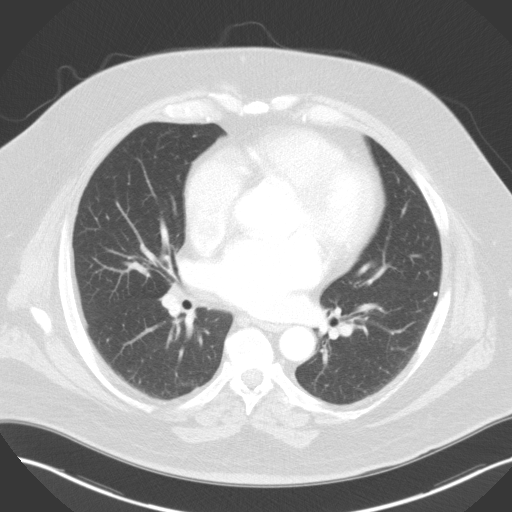
[im 75/112  soft-tissue]
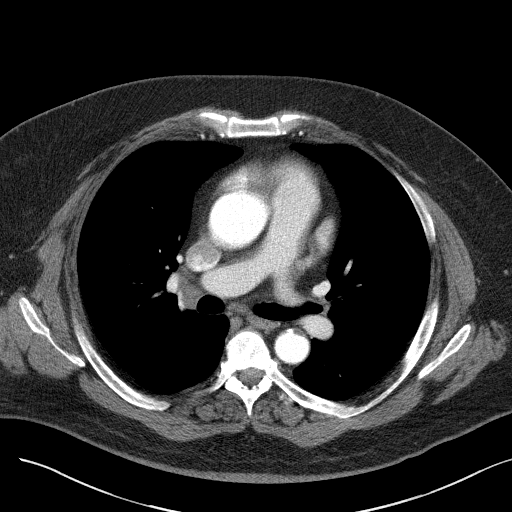
[im 84/112  lung]
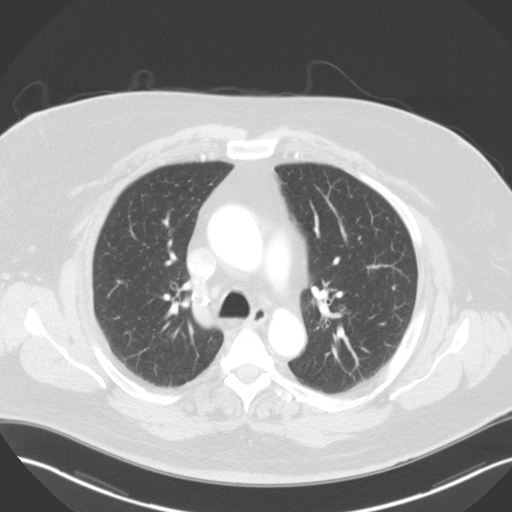
[im 93/112  soft-tissue]
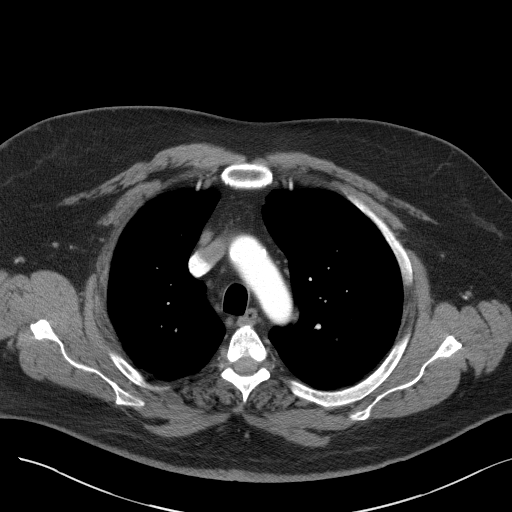
[im 102/112  lung]
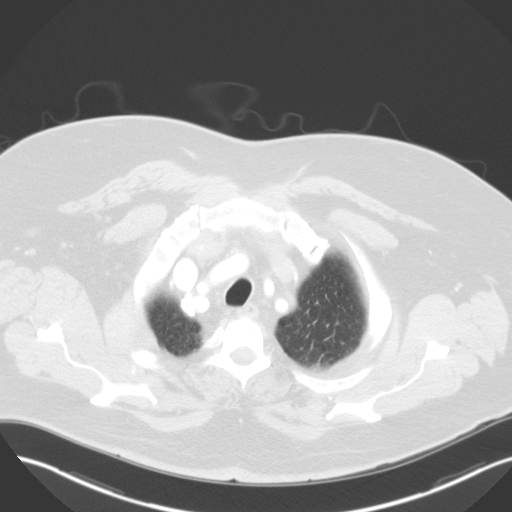

[Series 5: lung · axial · 0.78mm/px · z∈[-61,+51]mm · 4 of 168 slices shown]
[im 19/168  soft-tissue]
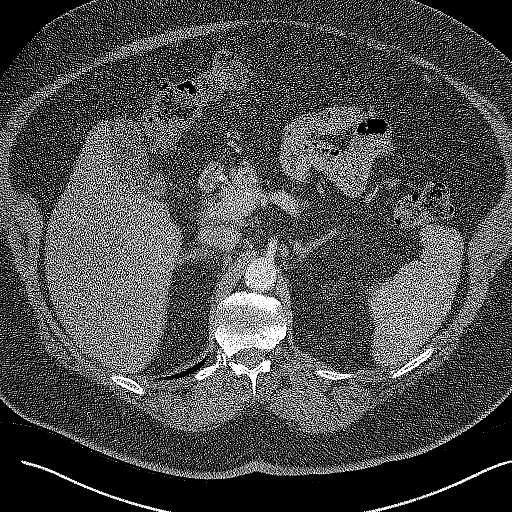
[im 38/168  soft-tissue]
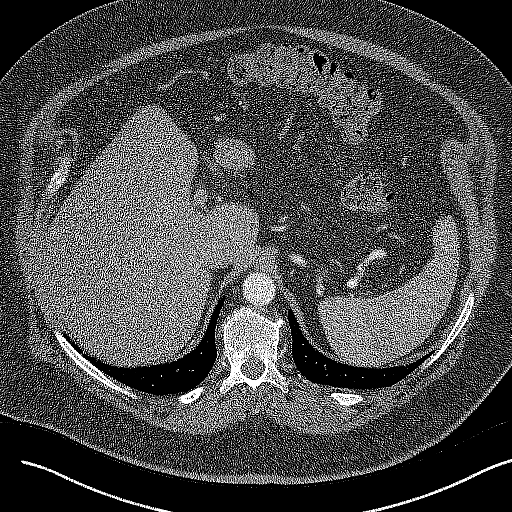
[im 56/168  soft-tissue]
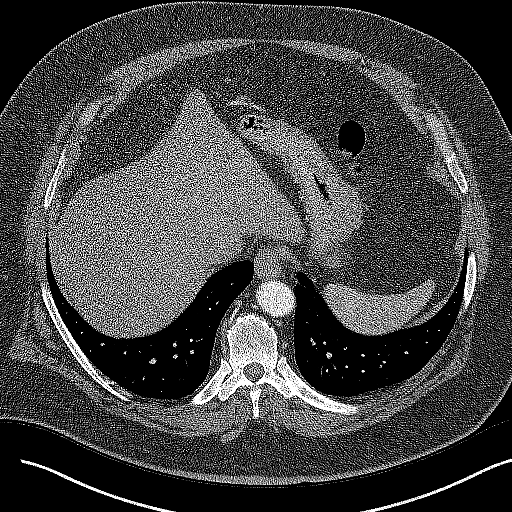
[im 75/168  soft-tissue]
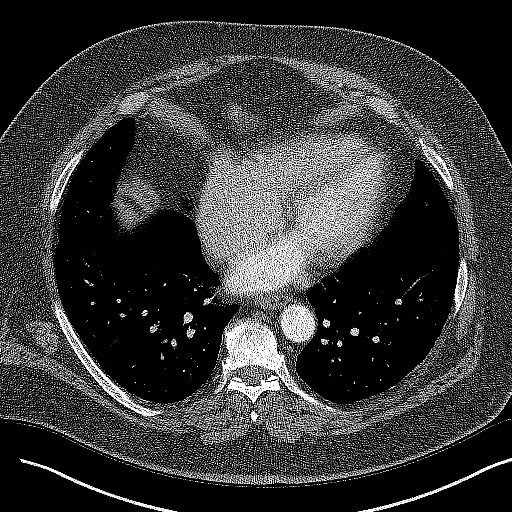

[Series 7: coronal · coronal · 0.68mm/px · 3 of 120 slices shown]
[im 30/120  soft-tissue]
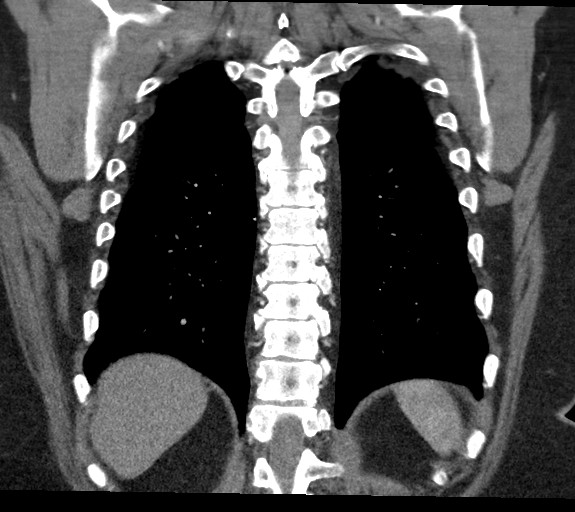
[im 60/120  soft-tissue]
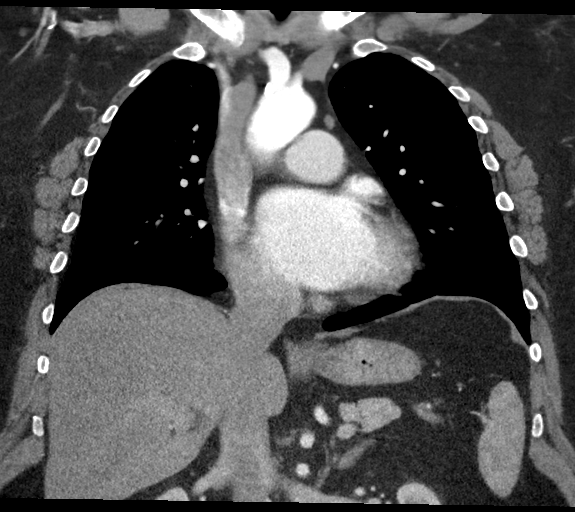
[im 90/120  soft-tissue]
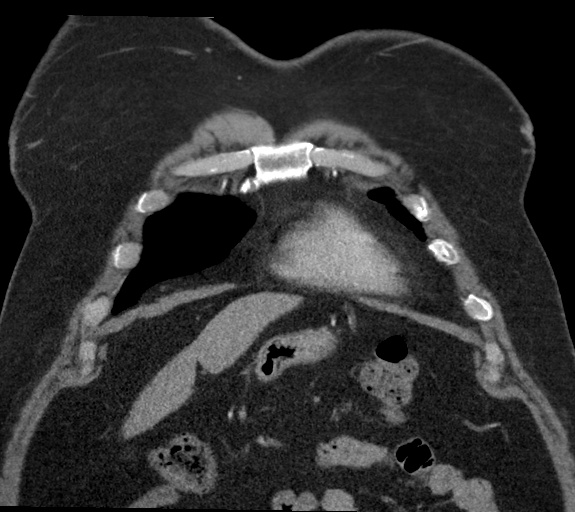

[18 of 46 positions shown; findings below may reference images not displayed]

FINDINGS: Cardiovascular: Borderline cardiomegaly with biatrial enlargement.
No pericardial effusion. Central pulmonary arteries unremarkable;
the exam was not optimized for detection of pulmonary emboli. Mild
coronary calcification in the LAD. Good contrast opacification of
the thoracic aorta. No dissection or stenosis.

Aortic Root:

--Valve: 3.1 cm

--Sinuses: 4 cm

--Sinotubular Junction: 3.8 cm

Limitations by motion: Moderate

Thoracic Aorta:

--Ascending Aorta: 4 cm

--Aortic Arch: 3.3 cm

--Descending Aorta:

Scattered calcified atheromatous plaque in the arch and descending
thoracic aorta. Classic 3 vessel brachiocephalic arterial origin
anatomy without proximal stenosis. Visualized proximal abdominal
aorta mildly atheromatous, otherwise unremarkable.

Mediastinum/Nodes: No mass or adenopathy.

Lungs/Pleura: No pleural effusion. No pneumothorax. Calcified
subcentimeter granulomas, lateral left lower lobe and laterally in
the right upper lobe. Minimal coarse linear interstitial opacities
in the anterior right upper lobe se[DATE], of uncertain chronicity.
Lungs otherwise clear.

Upper Abdomen: No acute findings. 1.6cm low-attenuation lesion in
hepatic segment 8, statistically most likely cyst in the absence of
a history of primary carcinoma.

Musculoskeletal: No chest wall abnormality. No acute or significant
osseous findings.

Review of the MIP images confirms the above findings.
IMPRESSION: 1. 4 cm ascending thoracic aortic aneurysm. Recommend annual imaging
followup by CTA or MRA. This recommendation follows 3818
ACCF/AHA/AATS/ACR/ASA/SCA/KAPOOR/PERLA/MERIDETH/[facility] Guidelines for the
Diagnosis and Management of Patients with Thoracic Aortic Disease.
Circulation. 3818; 121: E266-e369. Aortic aneurysm NOS (W8H8A-A8Z.I)

Aortic Atherosclerosis (W8H8A-VZI.I).

## 2021-06-01 ENCOUNTER — Other Ambulatory Visit: Payer: Self-pay | Admitting: Family Medicine

## 2021-06-01 DIAGNOSIS — M1A0790 Idiopathic chronic gout, unspecified ankle and foot, without tophus (tophi): Secondary | ICD-10-CM

## 2021-06-13 ENCOUNTER — Ambulatory Visit (INDEPENDENT_AMBULATORY_CARE_PROVIDER_SITE_OTHER): Payer: BC Managed Care – PPO | Admitting: Nurse Practitioner

## 2021-06-13 ENCOUNTER — Telehealth: Payer: Self-pay

## 2021-06-13 ENCOUNTER — Encounter: Payer: Self-pay | Admitting: Nurse Practitioner

## 2021-06-13 VITALS — BP 118/62 | HR 81 | Temp 97.9°F | Resp 14 | Ht 76.0 in | Wt 380.0 lb

## 2021-06-13 DIAGNOSIS — I700 Atherosclerosis of aorta: Secondary | ICD-10-CM | POA: Insufficient documentation

## 2021-06-13 DIAGNOSIS — M1A0790 Idiopathic chronic gout, unspecified ankle and foot, without tophus (tophi): Secondary | ICD-10-CM

## 2021-06-13 DIAGNOSIS — Z125 Encounter for screening for malignant neoplasm of prostate: Secondary | ICD-10-CM

## 2021-06-13 DIAGNOSIS — I4891 Unspecified atrial fibrillation: Secondary | ICD-10-CM | POA: Diagnosis not present

## 2021-06-13 DIAGNOSIS — I7121 Aneurysm of the ascending aorta, without rupture: Secondary | ICD-10-CM | POA: Insufficient documentation

## 2021-06-13 DIAGNOSIS — Z9989 Dependence on other enabling machines and devices: Secondary | ICD-10-CM

## 2021-06-13 DIAGNOSIS — Z6841 Body Mass Index (BMI) 40.0 and over, adult: Secondary | ICD-10-CM

## 2021-06-13 DIAGNOSIS — R600 Localized edema: Secondary | ICD-10-CM | POA: Insufficient documentation

## 2021-06-13 DIAGNOSIS — G4733 Obstructive sleep apnea (adult) (pediatric): Secondary | ICD-10-CM

## 2021-06-13 DIAGNOSIS — I10 Essential (primary) hypertension: Secondary | ICD-10-CM

## 2021-06-13 DIAGNOSIS — Z1211 Encounter for screening for malignant neoplasm of colon: Secondary | ICD-10-CM

## 2021-06-13 DIAGNOSIS — R5383 Other fatigue: Secondary | ICD-10-CM | POA: Insufficient documentation

## 2021-06-13 DIAGNOSIS — Z7689 Persons encountering health services in other specified circumstances: Secondary | ICD-10-CM

## 2021-06-13 NOTE — Assessment & Plan Note (Signed)
Urged healthy lifestyle modifications ?

## 2021-06-13 NOTE — Assessment & Plan Note (Signed)
Patient currently maintained on amlodipine 10 mg along with metoprolol 50 mg with a total of 75 mg daily.  Pressure well controlled in office.  Continue monitoring blood pressure at home and taking medication as prescribed ?

## 2021-06-13 NOTE — Assessment & Plan Note (Signed)
Patient currently on allopurinol 100 mg 1 tablet daily.  Prescriptions written for 2 tablets daily but patient's been taking 1.  He also has colchicine 0.6 mg he takes as needed for gout flares.  We will check uric acid and CMP today.  If uric acid's within normal limits and patient does not have continual gout flares we can leave him on allopurinol 100 mg daily.  If uric acid is still elevated we will bump up to 200 mg daily and recheck a CMP in 1 month. ?

## 2021-06-13 NOTE — Assessment & Plan Note (Signed)
Likely related to amlodipine use.  We will check basic labs to exclude heart failure in regards to BNP.  Did discuss we can always change amlodipine if swelling is aggressive or painful ?

## 2021-06-13 NOTE — Patient Instructions (Signed)
Nice to see you today ?I will be in touch with the lab results ?Follow up with me in 6 months, sooner if you need me ?

## 2021-06-13 NOTE — Assessment & Plan Note (Signed)
Noted on CT scan performed in 2021.  Currently 4 cm in diameter.  Per patient report cardiologist is monitoring.  Patient blood pressure well controlled. ?

## 2021-06-13 NOTE — Assessment & Plan Note (Signed)
Patient complains of fatigue could be multifactorial given patient does have atrial fibrillation, on a beta-blocker, and OSA with CPAP.  Check metabolic labs today ?

## 2021-06-13 NOTE — Progress Notes (Signed)
? ?New Patient Office Visit ? ?Subjective   ? ?Patient ID: Tony Hampton, male    DOB: 03-07-1955  Age: 66 y.o. MRN: [id number] ? ?CC:  ?Chief Complaint  ?Patient presents with  ? Establish Care  ?  From [facility]  ? Foot Pain  ?  Right foot, saw podiatrist and needs to get labs done to check for gout. Has gotten cortisone injection and is feeling better.  ? ? ?HPI ?Tony Hampton presents to establish care ?Establish care ? ?HTN: If he feels bad he will check his blood pressure. But not often. On amlodipine and metoprolol ? ?Gout: On allopurionol '100mg'$  daily and on colchicine as needed for flares ? ?Afib: Every 6 months. Metoprolol and xarletto. ? ?for complete physical and follow up of chronic conditions. ? ?Immunizations: ?-Tetanus:2016 ?-Influenza: out of season ?-Covid-19: refused ?-Shingles: refused ?-Pneumonia: refused ? ? ? ?Diet: Fair diet. States he eats a breakfast bar with coffee. States sandwich with chips and water. Dinner. [place] snacking with almonds, apple, and grapes. Will drink a soda everyother ?Exercise: No regular exercise. Daily routiene  ? ?Eye exam: Lasix in 2000. PRN ?Dental exam: Completes semi-annually Arman Bogus ? ? ?Colonoscopy: needs updating would like to go to [place]  ?Dexa:  ?PSA: Due ? ?Lung Cancer Screening:  ?Sleep:goes to bed around 9-930 up at 6-630. OSA mask most nights  ? ?Outpatient Encounter Medications as of 06/13/2021  ?Medication Sig  ? allopurinol (ZYLOPRIM) 100 MG tablet TAKE 1 TABLET BY MOUTH TWICE A DAY  ? amLODipine (NORVASC) 10 MG tablet Take 10 mg by mouth daily.  ? colchicine 0.6 MG tablet TAKE TWO TABLETS BY MOUTH AT ONSET OF GOUT FOLLOWED BY 1 TABLET IN 2 HOURS AS NEEDED.  ? metoprolol succinate (TOPROL-XL) 50 MG 24 hr tablet TAKE 1 AND 1/2 TABLETS BY MOUTH EVERY DAY  ? XARELTO 20 MG TABS tablet Take 20 mg by mouth daily.  ? ?No facility-administered encounter medications on file as of 06/13/2021.  ? ? ?Past Medical History:  ?Diagnosis Date   ? Atrial fibrillation ([place])   ? Gout   ? Hypertension   ? OSA (obstructive sleep apnea)   ? ? ?Past Surgical History:  ?Procedure Laterality Date  ? KNEE ARTHROSCOPY Right   ? 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 Dr. Marry Guan  ? TONSILLECTOMY    ? ? ?Family History  ?Problem Relation Age of Onset  ? Asthma Mother   ? Hypertension Mother   ? Other Mother   ?     blood clots in her lungs  ? Hypertension Father   ? Diabetes Father   ? Gallstones Father   ? Breast cancer Sister 83  ? Breast cancer Maternal Grandmother   ? ? ?Social History  ? ?Socioeconomic History  ? Marital status: Married  ?  Spouse name: Not on file  ? Number of children: Not on file  ? Years of education: Not on file  ? Highest education level: Not on file  ?Occupational History  ? Not on file  ?Tobacco Use  ? Smoking status: Former  ?  Packs/day: 1.00  ?  Years: 25.00  ?  Pack years: 25.00  ?  Types: Cigarettes  ?  Quit date: 2008  ?  Years since quitting: 15.3  ? Smokeless tobacco: Never  ?Vaping Use  ? Vaping Use: Never used  ?Substance and Sexual Activity  ? Alcohol use: Yes  ?  Comment: OCCASIONALLY. Beer once  everyother week  ? Drug use: No  ? Sexual activity: Not on file  ?Other Topics Concern  ? Not on file  ?Social History Narrative  ? Not on file  ? ?Social Determinants of Health  ? ?Financial Resource Strain: Not on file  ?Food Insecurity: Not on file  ?Transportation Needs: Not on file  ?Physical Activity: Not on file  ?Stress: Not on file  ?Social Connections: Not on file  ?Intimate Partner Violence: Not on file  ? ? ?Review of Systems  ?Constitutional:  Negative for chills and fever.  ?Respiratory:  Positive for shortness of breath (DOE.).   ?Cardiovascular:  Negative for chest pain and palpitations.  ?Gastrointestinal:  Negative for diarrhea, nausea and vomiting.  ?     BM every other day  ?Genitourinary:  Negative for dysuria and hematuria.  ?     Nocturia "1"  ?Neurological:  Negative for dizziness, tingling, weakness and  headaches.  ?Psychiatric/Behavioral:  Negative for hallucinations and suicidal ideas.   ? ?  ? ? ?Objective   ? ?BP 118/62   Pulse 81   Temp 97.9 ?F (36.6 ?C)   Resp 14   Ht '6\' 4"'$  (1.93 m)   Wt (!) 380 lb (172.4 kg)   SpO2 94%   BMI 46.26 kg/m?  ? ?Physical Exam ?Vitals and nursing note reviewed.  ?Constitutional:   ?   Appearance: Normal appearance. He is obese.  ?HENT:  ?   Right Ear: Tympanic membrane, ear canal and external ear normal.  ?   Left Ear: Tympanic membrane, ear canal and external ear normal.  ?   Mouth/Throat:  ?   Mouth: Mucous membranes are moist.  ?   Pharynx: Oropharynx is clear.  ?Eyes:  ?   Extraocular Movements: Extraocular movements intact.  ?   Pupils: Pupils are equal, round, and reactive to light.  ?Cardiovascular:  ?   Rate and Rhythm: Normal rate. Rhythm irregular.  ?   Heart sounds: Normal heart sounds.  ?Pulmonary:  ?   Effort: Pulmonary effort is normal.  ?   Breath sounds: Normal breath sounds.  ?Abdominal:  ?   General: Bowel sounds are normal. There is no distension.  ?   Palpations: There is no mass.  ?   Tenderness: There is no abdominal tenderness.  ?   Hernia: No hernia is present.  ?Musculoskeletal:  ?   Right lower leg: 2+ Pitting Edema present.  ?   Left lower leg: 2+ Pitting Edema present.  ?Skin: ?   General: Skin is warm.  ?Neurological:  ?   Mental Status: He is alert.  ?Psychiatric:     ?   Mood and Affect: Mood normal.     ?   Behavior: Behavior normal.     ?   Thought Content: Thought content normal.     ?   Judgment: Judgment normal.  ? ? ? ?  ? ?Assessment & Plan:  ? ?Problem List Items Addressed This Visit   ? ?  ? Cardiovascular and Mediastinum  ? Atrial fibrillation ([place])  ?  Currently maintained on metoprolol and Xarelto.  Followed by cardiology continue taking medication as prescribed follow-up as recommended. ? ?  ?  ? Essential (primary) hypertension  ?  Patient currently maintained on amlodipine 10 mg along with metoprolol 50 mg with a total of 75 mg  daily.  Pressure well controlled in office.  Continue monitoring blood pressure at home and taking medication as prescribed ? ?  ?  ?  Relevant Orders  ? CBC with Differential/Platelet  ? Comprehensive metabolic panel  ? Lipid panel  ? Aneurysm of ascending aorta without rupture ([facility])  ?  Noted on CT scan performed in 2021.  Currently 4 cm in diameter.  Per patient report cardiologist is monitoring.  Patient blood pressure well controlled. ? ?  ?  ? Aortic atherosclerosis ([place])  ?  Similar finding on same CT scan in 2021.  Patient not currently on statin therapy.  Pending lab results ? ?  ?  ?  ? Respiratory  ? OSA on CPAP  ?  Patient currently using CPAP.  Continue using CPAP as directed and follow-up with providers as recommended ? ?  ?  ?  ? Other  ? Obesity  ?  Urged healthy lifestyle modifications ? ?  ?  ? Relevant Orders  ? Hemoglobin A1c  ? Lipid panel  ? Gout  ?  Patient currently on allopurinol 100 mg 1 tablet daily.  Prescriptions written for 2 tablets daily but patient's been taking 1.  He also has colchicine 0.6 mg he takes as needed for gout flares.  We will check uric acid and CMP today.  If uric acid's within normal limits and patient does not have continual gout flares we can leave him on allopurinol 100 mg daily.  If uric acid is still elevated we will bump up to 200 mg daily and recheck a CMP in 1 month. ? ?  ?  ? Relevant Orders  ? Uric acid  ? Other fatigue  ?  Patient complains of fatigue could be multifactorial given patient does have atrial fibrillation, on a beta-blocker, and OSA with CPAP.  Check metabolic labs today ? ?  ?  ? Relevant Orders  ? TSH  ? Lower extremity edema  ?  Likely related to amlodipine use.  We will check basic labs to exclude heart failure in regards to BNP.  Did discuss we can always change amlodipine if swelling is aggressive or painful ? ?  ?  ? Relevant Orders  ? Brain natriuretic peptide  ? ?Other Visit Diagnoses   ? ? Establishing care with new doctor, encounter  for    -  Primary  ? Screening for prostate cancer      ? Relevant Orders  ? PSA  ? Screening for colon cancer      ? Relevant Orders  ? Ambulatory referral to Gastroenterology  ? ?  ? ? ?Return in about 6 months

## 2021-06-13 NOTE — Assessment & Plan Note (Signed)
Currently maintained on metoprolol and Xarelto.  Followed by cardiology continue taking medication as prescribed follow-up as recommended. ?

## 2021-06-13 NOTE — Assessment & Plan Note (Signed)
Patient currently using CPAP.  Continue using CPAP as directed and follow-up with providers as recommended ?

## 2021-06-13 NOTE — Assessment & Plan Note (Signed)
Similar finding on same CT scan in 2021.  Patient not currently on statin therapy.  Pending lab results ?

## 2021-06-13 NOTE — Telephone Encounter (Signed)
LVM for pt to return my call.   Thanks,  Klohe Lovering, CMA 

## 2021-06-14 ENCOUNTER — Telehealth: Payer: Self-pay

## 2021-06-14 LAB — COMPREHENSIVE METABOLIC PANEL
AG Ratio: 1.4 (calc) (ref 1.0–2.5)
ALT: 12 U/L (ref 9–46)
AST: 12 U/L (ref 10–35)
Albumin: 3.9 g/dL (ref 3.6–5.1)
Alkaline phosphatase (APISO): 54 U/L (ref 35–144)
BUN: 15 mg/dL (ref 7–25)
CO2: 25 mmol/L (ref 20–32)
Calcium: 9.1 mg/dL (ref 8.6–10.3)
Chloride: 106 mmol/L (ref 98–110)
Creat: 0.98 mg/dL (ref 0.70–1.35)
Globulin: 2.7 g/dL (calc) (ref 1.9–3.7)
Glucose, Bld: 99 mg/dL (ref 65–99)
Potassium: 4.5 mmol/L (ref 3.5–5.3)
Sodium: 141 mmol/L (ref 135–146)
Total Bilirubin: 1.1 mg/dL (ref 0.2–1.2)
Total Protein: 6.6 g/dL (ref 6.1–8.1)

## 2021-06-14 LAB — CBC WITH DIFFERENTIAL/PLATELET
Absolute Monocytes: 823 cells/uL (ref 200–950)
Basophils Absolute: 28 cells/uL (ref 0–200)
Basophils Relative: 0.5 %
Eosinophils Absolute: 162 cells/uL (ref 15–500)
Eosinophils Relative: 2.9 %
HCT: 45.6 % (ref 38.5–50.0)
Hemoglobin: 15 g/dL (ref 13.2–17.1)
Lymphs Abs: 1204 cells/uL (ref 850–3900)
MCH: 30.4 pg (ref 27.0–33.0)
MCHC: 32.9 g/dL (ref 32.0–36.0)
MCV: 92.5 fL (ref 80.0–100.0)
MPV: 11.3 fL (ref 7.5–12.5)
Monocytes Relative: 14.7 %
Neutro Abs: 3382 cells/uL (ref 1500–7800)
Neutrophils Relative %: 60.4 %
Platelets: 185 10*3/uL (ref 140–400)
RBC: 4.93 10*6/uL (ref 4.20–5.80)
RDW: 12.7 % (ref 11.0–15.0)
Total Lymphocyte: 21.5 %
WBC: 5.6 10*3/uL (ref 3.8–10.8)

## 2021-06-14 LAB — LIPID PANEL
Cholesterol: 131 mg/dL (ref <200)
HDL: 39 mg/dL — ABNORMAL LOW (ref >=40)
LDL Cholesterol (Calc): 71 mg/dL (calc)
Non-HDL Cholesterol (Calc): 92 mg/dL (calc) (ref <130)
Total CHOL/HDL Ratio: 3.4 (calc) (ref <5.0)
Triglycerides: 119 mg/dL (ref <150)

## 2021-06-14 LAB — HEMOGLOBIN A1C
Hgb A1c MFr Bld: 5.6 % of total Hgb (ref <5.7)
Mean Plasma Glucose: 114 mg/dL
eAG (mmol/L): 6.3 mmol/L

## 2021-06-14 LAB — PSA: PSA: 1.66 ng/mL (ref <=4.00)

## 2021-06-14 LAB — URIC ACID: Uric Acid, Serum: 7.7 mg/dL (ref 4.0–8.0)

## 2021-06-14 LAB — TSH: TSH: 1.23 mIU/L (ref 0.40–4.50)

## 2021-06-14 LAB — BRAIN NATRIURETIC PEPTIDE: Brain Natriuretic Peptide: 205 pg/mL — ABNORMAL HIGH (ref <100)

## 2021-06-14 NOTE — Telephone Encounter (Signed)
CALLED PATIENT NO ANSWER LEFT VOICEMAIL FOR A CALL BACK °Letter sent °

## 2021-06-14 NOTE — Telephone Encounter (Signed)
Patient returned your call. Requesting a call back.

## 2021-06-14 NOTE — Addendum Note (Signed)
Addended by: Kris Mouton on: 06/14/2021 12:52 PM ? ? Modules accepted: Orders ? ?

## 2021-06-15 ENCOUNTER — Telehealth: Payer: Self-pay

## 2021-06-15 ENCOUNTER — Other Ambulatory Visit: Payer: Self-pay

## 2021-06-15 DIAGNOSIS — Z86010 Personal history of colonic polyps: Secondary | ICD-10-CM

## 2021-06-15 MED ORDER — NA SULFATE-K SULFATE-MG SULF 17.5-3.13-1.6 GM/177ML PO SOLN: 1.0000 | Freq: Once | ORAL | 0 refills | Status: AC

## 2021-06-15 NOTE — Telephone Encounter (Signed)
Called patient we received his blood thinner back no answer left voicemail ?

## 2021-06-15 NOTE — Progress Notes (Signed)
Gastroenterology Pre-Procedure Review ? ?Request Date: 07/31/2021 ?Requesting Physician: Dr. Marius Ditch ? ? ?PATIENT REVIEW QUESTIONS: The patient responded to the following health history questions as indicated:   ? ?1. Are you having any GI issues? no ?2. Do you have a personal history of Polyps? yes (last colonoscopy ) ?3. Do you have a family history of Colon Cancer or Polyps? no ?4. Diabetes Mellitus? no ?5. Joint replacements in the past 12 months?yes (2016 knee) ?6. Major health problems in the past 3 months?no ?7. Any artificial heart valves, MVP, or defibrillator?no ?   ?MEDICATIONS & ALLERGIES:    ?Patient reports the following regarding taking any anticoagulation/antiplatelet therapy:   ?Plavix, Coumadin, Eliquis, Xarelto, Lovenox, Pradaxa, Brilinta, or Effient? yes (xarelto) ?Aspirin? no ? ?Patient confirms/reports the following medications:  ?Current Outpatient Medications  ?Medication Sig Dispense Refill  ? allopurinol (ZYLOPRIM) 100 MG tablet Take 100 mg by mouth daily.    ? amLODipine (NORVASC) 10 MG tablet Take 10 mg by mouth daily.    ? colchicine 0.6 MG tablet TAKE TWO TABLETS BY MOUTH AT ONSET OF GOUT FOLLOWED BY 1 TABLET IN 2 HOURS AS NEEDED. 20 tablet 4  ? metoprolol succinate (TOPROL-XL) 50 MG 24 hr tablet TAKE 1 AND 1/2 TABLETS BY MOUTH EVERY DAY 135 tablet 0  ? XARELTO 20 MG TABS tablet Take 20 mg by mouth daily.    ? ?No current facility-administered medications for this visit.  ? ? ?Patient confirms/reports the following allergies:  ?No Known Allergies ? ?No orders of the defined types were placed in this encounter. ? ? ?AUTHORIZATION INFORMATION ?Primary Insurance: ?1D#: ?Group #: ? ?Secondary Insurance: ?1D#: ?Group #: ? ?SCHEDULE INFORMATION: ?Date: 07/31/2021 ?Time: ?Location:[facility] ? ?

## 2021-06-19 ENCOUNTER — Encounter: Payer: Self-pay | Admitting: Nurse Practitioner

## 2021-06-20 ENCOUNTER — Telehealth: Payer: Self-pay

## 2021-06-20 MED ORDER — WEGOVY 0.25 MG/0.5ML ~~LOC~~ SOAJ: 0.2500 mg | SUBCUTANEOUS | 0 refills | Status: DC

## 2021-06-20 NOTE — Telephone Encounter (Signed)
Spoke with wife she understands for him to stop his xarelto  3 days prior to procedure and can restart 1 day after per dr Nehemiah Massed ?

## 2021-06-20 NOTE — Telephone Encounter (Signed)
Ordered wegovy. If approved will need a 3 month office visit with me please ?

## 2021-06-23 ENCOUNTER — Encounter: Payer: Self-pay | Admitting: Nurse Practitioner

## 2021-06-23 NOTE — Telephone Encounter (Signed)
Please see my response to the patient ?

## 2021-06-26 ENCOUNTER — Ambulatory Visit: Payer: BC Managed Care – PPO | Admitting: Podiatry

## 2021-06-26 DIAGNOSIS — M79671 Pain in right foot: Secondary | ICD-10-CM | POA: Diagnosis not present

## 2021-06-26 DIAGNOSIS — M10471 Other secondary gout, right ankle and foot: Secondary | ICD-10-CM

## 2021-06-26 MED ORDER — METHYLPREDNISOLONE 4 MG PO TBPK: ORAL_TABLET | ORAL | 0 refills | Status: DC

## 2021-06-26 MED ORDER — SAXENDA 18 MG/3ML ~~LOC~~ SOPN: PEN_INJECTOR | SUBCUTANEOUS | 0 refills | Status: AC

## 2021-06-26 NOTE — Progress Notes (Signed)
?  Subjective:  ?Patient ID: Tony Hampton, male    DOB: 06/24/1955,  MRN: [id number] ? ?Chief Complaint  ?Patient presents with  ? Gout  ?   R foot swollen, red  ? ? ?66 y.o. male presents with the above complaint. History confirmed with patient.  He may be having a gout flare that began around the time of the Super Bowl he had types of shellfish such as shrimp and scallops of the time.  Is been red and swollen and hot since then.  He does take allopurinol chronically.  Has a prescription for colchicine at home but has not taken it. ? ?Interval history: ?Had some relief from the injection the last visit took his colchicine as well has not completely resolved. ?Objective:  ?Physical Exam: ?warm, good capillary refill, no trophic changes or ulcerative lesions, normal DP and PT pulses, normal sensory exam, and painful erythematous first MTPJ on the right, improved but is still painful ? ?Radiographs: ?Multiple views x-ray of the right foot: So far no appear articular or intra-articular erosions or joint space narrowing is noted ?Assessment:  ? ?1. Acute gout due to other secondary cause involving toe of right foot   ? ? ? ?Plan:  ?Patient was evaluated and treated and all questions answered. ? ?Has had some improvement with injection.  His uric acid was recently checked again and was at 7.7.  I do think he likely needs to be on twice daily dosing of allopurinol to get him closer to 6.0 which would be the target uric acid level for him.  To continue to treat the acute flare I recommended a prednisone taper and then he will repeat colchicine if needed in 1 week. ? ?Return if symptoms worsen or fail to improve.  ? ?

## 2021-06-26 NOTE — Patient Instructions (Signed)
Take the methylprednisolone taper as directed. If still painful in 1 week, take a round of colchicine (2 tablets, then 1 tablet 1 hour after, then 1 daily for 1 week) ? ?Start taking allopurinol twice daily ('100mg'$  in AM and '100mg'$  in PM) ?

## 2021-07-05 ENCOUNTER — Other Ambulatory Visit: Payer: Self-pay | Admitting: Podiatry

## 2021-07-06 ENCOUNTER — Telehealth: Payer: Self-pay | Admitting: Podiatry

## 2021-07-06 NOTE — Telephone Encounter (Signed)
The gout is back and they would like to know if they can do another round of prednisone ?  Pharmacy [facility]  on [address] , please advise .

## 2021-07-09 MED ORDER — METHYLPREDNISOLONE 4 MG PO TBPK: ORAL_TABLET | ORAL | 0 refills | Status: DC

## 2021-07-19 NOTE — Telephone Encounter (Signed)
This is for the saxenda not the wegovy?

## 2021-07-19 NOTE — Telephone Encounter (Signed)
Right yes, both are expensive basically, Saxenda is $1300 and Wegovy $1200

## 2021-07-31 ENCOUNTER — Ambulatory Visit: Payer: BC Managed Care – PPO | Admitting: Anesthesiology

## 2021-07-31 ENCOUNTER — Encounter: Payer: Self-pay | Admitting: Gastroenterology

## 2021-07-31 ENCOUNTER — Ambulatory Visit
Admission: RE | Admit: 2021-07-31 | Discharge: 2021-07-31 | Disposition: A | Discharge status: Home / Self Care | Payer: BC Managed Care – PPO | Attending: Gastroenterology | Admitting: Gastroenterology

## 2021-07-31 ENCOUNTER — Encounter
Admission: RE | Disposition: A | Discharge status: Home / Self Care | Payer: Self-pay | Source: Home / Self Care | Attending: Gastroenterology

## 2021-07-31 DIAGNOSIS — D125 Benign neoplasm of sigmoid colon: Secondary | ICD-10-CM | POA: Insufficient documentation

## 2021-07-31 DIAGNOSIS — Z87891 Personal history of nicotine dependence: Secondary | ICD-10-CM | POA: Insufficient documentation

## 2021-07-31 DIAGNOSIS — Z1211 Encounter for screening for malignant neoplasm of colon: Secondary | ICD-10-CM | POA: Diagnosis present

## 2021-07-31 DIAGNOSIS — Z860100 Personal history of colon polyps, unspecified: Secondary | ICD-10-CM

## 2021-07-31 DIAGNOSIS — D122 Benign neoplasm of ascending colon: Secondary | ICD-10-CM | POA: Insufficient documentation

## 2021-07-31 DIAGNOSIS — D123 Benign neoplasm of transverse colon: Secondary | ICD-10-CM | POA: Insufficient documentation

## 2021-07-31 DIAGNOSIS — D124 Benign neoplasm of descending colon: Secondary | ICD-10-CM | POA: Diagnosis not present

## 2021-07-31 DIAGNOSIS — K644 Residual hemorrhoidal skin tags: Secondary | ICD-10-CM | POA: Diagnosis not present

## 2021-07-31 DIAGNOSIS — K635 Polyp of colon: Secondary | ICD-10-CM

## 2021-07-31 HISTORY — DX: Aneurysm of unspecified site: I72.9

## 2021-07-31 HISTORY — PX: COLONOSCOPY WITH PROPOFOL: SHX5780

## 2021-07-31 SURGERY — COLONOSCOPY WITH PROPOFOL
Anesthesia: General

## 2021-07-31 MED ORDER — PROPOFOL 10 MG/ML IV BOLUS
INTRAVENOUS | Status: AC
  Filled 2021-07-31: qty 40

## 2021-07-31 MED ORDER — SODIUM CHLORIDE 0.9 % IV SOLN: INTRAVENOUS | Status: DC

## 2021-07-31 MED ORDER — PROPOFOL 500 MG/50ML IV EMUL
INTRAVENOUS | Status: AC
  Filled 2021-07-31: qty 250

## 2021-07-31 MED ORDER — PROPOFOL 10 MG/ML IV BOLUS
INTRAVENOUS | Status: DC | PRN
  Administered 2021-07-31: 30 mg via INTRAVENOUS
  Administered 2021-07-31: 100 mg via INTRAVENOUS
  Administered 2021-07-31: 30 mg via INTRAVENOUS
  Administered 2021-07-31: 40 mg via INTRAVENOUS
  Administered 2021-07-31 (×3): 30 mg via INTRAVENOUS

## 2021-07-31 MED ORDER — LIDOCAINE HCL (PF) 2 % IJ SOLN
INTRAMUSCULAR | Status: AC
  Filled 2021-07-31: qty 5

## 2021-07-31 NOTE — Op Note (Signed)
[facility] Gastroenterology Patient Name: Tony Hampton Procedure Date: 07/31/2021 7:34 AM MRN: [id number] Account #: [id number] Date of Birth: 1957-03-03 Admit Type: Outpatient Age: 66 Room: [facility] ENDO ROOM 1 Gender: Male Note Status: Finalized Instrument Name: Colonoscope [id number] Procedure:             Colonoscopy Indications:           High risk colon cancer surveillance: Personal history                         of colonic polyps, Last colonoscopy: October 2005 Providers:             Lin Landsman MD, MD Referring MD:          Alyson Locket. Charmian Muff (Referring MD) Medicines:             General Anesthesia Complications:         No immediate complications. Estimated blood loss: None. Procedure:             Pre-Anesthesia Assessment:                        - Prior to the procedure, a History and Physical was                         performed, and patient medications and allergies were                         reviewed. The patient is competent. The risks and                         benefits of the procedure and the sedation options and                         risks were discussed with the patient. All questions                         were answered and informed consent was obtained.                         Patient identification and proposed procedure were                         verified by the physician, the nurse, the                         anesthesiologist, the anesthetist and the technician                         in the pre-procedure area in the procedure room in the                         endoscopy suite. Mental Status Examination: alert and                         oriented. Airway Examination: normal oropharyngeal                         airway and neck mobility. Respiratory Examination:  clear to auscultation. CV Examination: normal.                         Prophylactic Antibiotics: The patient does not require                          prophylactic antibiotics. Prior Anticoagulants: The                         patient has taken Xarelto (rivaroxaban), last dose was                         4 days prior to procedure. ASA Grade Assessment: III -                         A patient with severe systemic disease. After                         reviewing the risks and benefits, the patient was                         deemed in satisfactory condition to undergo the                         procedure. The anesthesia plan was to use general                         anesthesia. Immediately prior to administration of                         medications, the patient was re-assessed for adequacy                         to receive sedatives. The heart rate, respiratory                         rate, oxygen saturations, blood pressure, adequacy of                         pulmonary ventilation, and response to care were                         monitored throughout the procedure. The physical                         status of the patient was re-assessed after the                         procedure.                        After obtaining informed consent, the colonoscope was                         passed under direct vision. Throughout the procedure,                         the patient's blood pressure, pulse, and oxygen  saturations were monitored continuously. The                         Colonoscope was introduced through the anus and                         advanced to the the cecum, identified by appendiceal                         orifice and ileocecal valve. The colonoscopy was                         performed without difficulty. The patient tolerated                         the procedure well. The quality of the bowel                         preparation was evaluated using the BBPS [facility] Bowel                         Preparation Scale) with scores of: Right Colon = 3,                         Transverse Colon = 3  and Left Colon = 3 (entire mucosa                         seen well with no residual staining, small fragments                         of stool or opaque liquid). The total BBPS score                         equals 9. Findings:      Skin tags were found on perianal exam.      13 sessile polyps were found in the sigmoid colon 6, descending colon 6,       transverse colon 5 and ascending colon 1. The polyps were 3 to 7 mm in       size. These polyps were removed with a cold snare. Resection and       retrieval were complete. Estimated blood loss: none.      The retroflexed view of the distal rectum and anal verge was normal and       showed no anal or rectal abnormalities. Impression:            - Perianal skin tags found on perianal exam.                        - 13 3 to 7 mm polyps in the sigmoid colon, in the                         descending colon, in the transverse colon and in the                         ascending colon, removed with a cold snare. Resected  and retrieved.                        - The distal rectum and anal verge are normal on                         retroflexion view. Recommendation:        - Discharge patient to home (with escort).                        - Resume previous diet today.                        - Continue present medications.                        - Await pathology results.                        - Repeat colonoscopy in 1 year for surveillance of                         multiple polyps. Procedure Code(s):     --- Professional ---                        [phone], Colonoscopy, flexible; with removal of                         tumor(s), polyp(s), or other lesion(s) by snare                         technique Diagnosis Code(s):     --- Professional ---                        Z86.010, Personal history of colonic polyps                        K63.5, Polyp of colon                        K64.4, Residual hemorrhoidal skin tags CPT  copyright 2019 American Medical Association. All rights reserved. The codes documented in this report are preliminary and upon coder review may  be revised to meet current compliance requirements. Dr. Ulyess Mort Lin Landsman MD, MD 07/31/2021 8:09:29 AM This report has been signed electronically. Number of Addenda: 0 Note Initiated On: 07/31/2021 7:34 AM Scope Withdrawal Time: 0 hours 22 minutes 44 seconds  Total Procedure Duration: 0 hours 26 minutes 8 seconds  Estimated Blood Loss:  Estimated blood loss: none.      [facility]

## 2021-07-31 NOTE — H&P (Signed)
Cephas Darby, MD [address]  [place]  [place], [place] [address]  Main: [phone]  Fax: [phone] Pager: [phone]  Primary Care Physician:  Michela Pitcher, NP Primary Gastroenterologist:  Dr. Cephas Darby  Pre-Procedure History & Physical: HPI:  Tony Hampton is a 66 y.o. male is here for an colonoscopy.   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 Marry Guan   TONSILLECTOMY      Prior to Admission medications   Medication Sig Start Date End Date Taking? Authorizing Provider  amLODipine (NORVASC) 10 MG tablet Take 10 mg by mouth daily. 12/24/20  Yes [provider]  metoprolol succinate (TOPROL-XL) 50 MG 24 hr tablet TAKE 1 AND 1/2 TABLETS BY MOUTH EVERY DAY 12/18/18  Yes Chrismon, Vickki Muff, PA-C  allopurinol (ZYLOPRIM) 100 MG tablet Take 100 mg by mouth 2 (two) times daily.    [provider]  colchicine 0.6 MG tablet TAKE TWO TABLETS BY MOUTH AT ONSET OF GOUT FOLLOWED BY 1 TABLET IN 2 HOURS AS NEEDED. 10/26/20   Chrismon, Vickki Muff, PA-C  methylPREDNISolone (MEDROL DOSEPAK) 4 MG TBPK tablet 6 day dose pack - take as directed Patient not taking: Reported on 07/31/2021 07/09/21   Criselda Peaches, DPM  XARELTO 20 MG TABS tablet Take 20 mg by mouth daily. 11/25/15   [provider]    Allergies as of 06/15/2021   (No Known Allergies)    Family History  Problem Relation Age of Onset   Asthma Mother    Hypertension Mother    Other Mother        blood clots in her lungs   Hypertension Father    Diabetes Father    Gallstones Father    Breast cancer Sister 65   Breast cancer Maternal Grandmother     Social History   Socioeconomic History   Marital status: Married    Spouse name: Not on  file   Number of children: Not on file   Years of education: Not on file   Highest education level: Not on file  Occupational History   Not on file  Tobacco Use   Smoking status: Former    Packs/day: 1.00    Years: 25.00    Pack years: 25.00    Types: Cigarettes    Quit date: 2008    Years since quitting: 15.4   Smokeless tobacco: Never  Vaping Use   Vaping Use: Never used  Substance and Sexual Activity   Alcohol use: Yes    Comment: OCCASIONALLY. Beer once everyother week   Drug use: No   Sexual activity: Not on file  Other Topics Concern   Not on file  Social History Narrative   Not on file   Social Determinants of Health   Financial Resource Strain: Not on file  Food Insecurity: Not on file  Transportation Needs: Not on file  Physical Activity: Not on file  Stress: Not on file  Social Connections: Not on file  Intimate Partner Violence: Not on file    Review of Systems: See HPI, otherwise negative ROS  Physical Exam: BP 97/70   Pulse 69   Temp (!) 97.1  F (36.2 C) (Temporal)   Resp 12   Ht '6\' 3"'$  (1.905 m)   Wt (!) 170.1 kg   SpO2 92%   BMI 46.87 kg/m  General:   Alert,  pleasant and cooperative in NAD Head:  Normocephalic and atraumatic. Neck:  Supple; no masses or thyromegaly. Lungs:  Clear throughout to auscultation.    Heart:  Regular rate and rhythm. Abdomen:  Soft, nontender and nondistended. Normal bowel sounds, without guarding, and without rebound.   Neurologic:  Alert and  oriented x4;  grossly normal neurologically.  Impression/Plan: Tony Hampton is here for an colonoscopy to be performed for history of colon polyps  Risks, benefits, limitations, and alternatives regarding  colonoscopy have been reviewed with the patient.  Questions have been answered.  All parties agreeable.   Sherri Sear, MD  07/31/2021, 8:15 AM

## 2021-07-31 NOTE — Transfer of Care (Signed)
Immediate Anesthesia Transfer of Care Note  Patient: Tony Hampton  Procedure(s) Performed: COLONOSCOPY WITH PROPOFOL  Patient Location: Endoscopy Unit  Anesthesia Type:General  Level of Consciousness: awake, alert  and oriented  Airway & Oxygen Therapy: Patient Spontanous Breathing and Patient connected to nasal cannula oxygen  Post-op Assessment: Report given to RN, Post -op Vital signs reviewed and stable and Patient moving all extremities  Post vital signs: Reviewed and stable  Last Vitals:  Vitals Value Taken Time  BP 97/70 07/31/21 0813  Temp 36.2 C 07/31/21 0810  Pulse 69 07/31/21 0813  Resp 12 07/31/21 0813  SpO2 92 % 07/31/21 0813    Last Pain:  Vitals:   07/31/21 0810  TempSrc: Temporal  PainSc: Asleep         Complications: No notable events documented.

## 2021-07-31 NOTE — Anesthesia Preprocedure Evaluation (Signed)
Anesthesia Evaluation  Patient identified by MRN, date of birth, ID band Patient awake    Reviewed: Allergy & Precautions, H&P , NPO status , Patient's Chart, lab work & pertinent test results, reviewed documented beta blocker date and time   Airway Mallampati: III   Neck ROM: full    Dental  (+) Poor Dentition   Pulmonary sleep apnea , former smoker,    Pulmonary exam normal        Cardiovascular Exercise Tolerance: Poor hypertension, On Medications negative cardio ROS Normal cardiovascular exam Rhythm:regular Rate:Normal     Neuro/Psych  Headaches, PSYCHIATRIC DISORDERS    GI/Hepatic negative GI ROS, Neg liver ROS,   Endo/Other  Morbid obesity  Renal/GU negative Renal ROS  negative genitourinary   Musculoskeletal   Abdominal   Peds  Hematology negative hematology ROS (+)   Anesthesia Other Findings Past Medical History: No date: Atrial fibrillation (HCC) No date: Gout No date: Hypertension No date: OSA (obstructive sleep apnea) Past Surgical History: No date: KNEE ARTHROSCOPY; Right Feb. 2016: REPLACEMENT TOTAL KNEE; Right     Comment:  Dr. Marry Guan No date: TONSILLECTOMY   Reproductive/Obstetrics negative OB ROS                             Anesthesia Physical Anesthesia Plan  ASA: 3  Anesthesia Plan: General   Post-op Pain Management:    Induction:   PONV Risk Score and Plan:   Airway Management Planned:   Additional Equipment:   Intra-op Plan:   Post-operative Plan:   Informed Consent: I have reviewed the patients History and Physical, chart, labs and discussed the procedure including the risks, benefits and alternatives for the proposed anesthesia with the patient or authorized representative who has indicated his/her understanding and acceptance.     Dental Advisory Given  Plan Discussed with: CRNA  Anesthesia Plan Comments:         Anesthesia Quick  Evaluation

## 2021-08-01 ENCOUNTER — Encounter: Payer: Self-pay | Admitting: Gastroenterology

## 2021-08-01 LAB — SURGICAL PATHOLOGY

## 2021-08-01 NOTE — Anesthesia Postprocedure Evaluation (Signed)
Anesthesia Post Note  Patient: Tony Hampton  Procedure(s) Performed: COLONOSCOPY WITH PROPOFOL  Patient location during evaluation: PACU Anesthesia Type: General Level of consciousness: awake and alert Pain management: pain level controlled Vital Signs Assessment: post-procedure vital signs reviewed and stable Respiratory status: spontaneous breathing, nonlabored ventilation, respiratory function stable and patient connected to nasal cannula oxygen Cardiovascular status: blood pressure returned to baseline and stable Postop Assessment: no apparent nausea or vomiting Anesthetic complications: no   No notable events documented.   Last Vitals:  Vitals:   07/31/21 0820 07/31/21 0830  BP: 97/70 118/69  Pulse: 69 63  Resp: 19 17  Temp:    SpO2: 94% 94%    Last Pain:  Vitals:   07/31/21 0830  TempSrc:   PainSc: 0-No pain                 Molli Barrows

## 2021-08-02 ENCOUNTER — Telehealth: Payer: Self-pay

## 2021-08-02 DIAGNOSIS — Z86010 Personal history of colonic polyps: Secondary | ICD-10-CM

## 2021-08-02 NOTE — Telephone Encounter (Signed)
Placed referral  

## 2021-08-02 NOTE — Telephone Encounter (Signed)
-----   Message from Lin Landsman, MD sent at 08/01/2021  5:14 PM EDT ----- Recommend referral to genetic counselor for history of multiple tubular adenomas of the colon, more than 10 in number Recommend surveillance colonoscopy in 1 year  RV

## 2021-08-17 ENCOUNTER — Inpatient Hospital Stay: Payer: BC Managed Care – PPO | Admitting: Licensed Clinical Social Worker

## 2021-08-17 ENCOUNTER — Inpatient Hospital Stay: Payer: BC Managed Care – PPO

## 2021-09-03 ENCOUNTER — Other Ambulatory Visit: Payer: Self-pay | Admitting: Nurse Practitioner

## 2021-09-03 DIAGNOSIS — M1A0790 Idiopathic chronic gout, unspecified ankle and foot, without tophus (tophi): Secondary | ICD-10-CM

## 2021-10-10 ENCOUNTER — Ambulatory Visit
Admission: RE | Admit: 2021-10-10 | Discharge: 2021-10-10 | Disposition: A | Discharge status: Home / Self Care | Payer: BC Managed Care – PPO | Source: Ambulatory Visit | Attending: Internal Medicine | Admitting: Internal Medicine

## 2021-10-10 ENCOUNTER — Other Ambulatory Visit: Payer: Self-pay | Admitting: Internal Medicine

## 2021-10-10 DIAGNOSIS — I7121 Aneurysm of the ascending aorta, without rupture: Secondary | ICD-10-CM

## 2021-10-10 LAB — POCT I-STAT CREATININE: Creatinine, Ser: 0.9 mg/dL (ref 0.61–1.24)

## 2021-10-10 MED ORDER — IOHEXOL 350 MG/ML SOLN
75.0000 mL | Freq: Once | INTRAVENOUS | Status: AC | PRN
  Administered 2021-10-10: 75 mL via INTRAVENOUS

## 2021-12-06 ENCOUNTER — Other Ambulatory Visit: Payer: Self-pay | Admitting: Nurse Practitioner

## 2021-12-06 DIAGNOSIS — M1A0790 Idiopathic chronic gout, unspecified ankle and foot, without tophus (tophi): Secondary | ICD-10-CM

## 2021-12-13 ENCOUNTER — Encounter: Payer: Self-pay | Admitting: Nurse Practitioner

## 2021-12-13 ENCOUNTER — Ambulatory Visit (INDEPENDENT_AMBULATORY_CARE_PROVIDER_SITE_OTHER): Payer: Medicare HMO | Admitting: Nurse Practitioner

## 2021-12-13 VITALS — BP 120/76 | HR 70 | Temp 96.8°F | Resp 16 | Ht 74.0 in | Wt 384.2 lb

## 2021-12-13 DIAGNOSIS — Z6841 Body Mass Index (BMI) 40.0 and over, adult: Secondary | ICD-10-CM

## 2021-12-13 DIAGNOSIS — I10 Essential (primary) hypertension: Secondary | ICD-10-CM

## 2021-12-13 DIAGNOSIS — M1A0790 Idiopathic chronic gout, unspecified ankle and foot, without tophus (tophi): Secondary | ICD-10-CM

## 2021-12-13 DIAGNOSIS — Z0000 Encounter for general adult medical examination without abnormal findings: Secondary | ICD-10-CM

## 2021-12-13 DIAGNOSIS — E66813 Obesity, class 3: Secondary | ICD-10-CM

## 2021-12-13 DIAGNOSIS — R351 Nocturia: Secondary | ICD-10-CM

## 2021-12-13 DIAGNOSIS — G4733 Obstructive sleep apnea (adult) (pediatric): Secondary | ICD-10-CM

## 2021-12-13 DIAGNOSIS — I7121 Aneurysm of the ascending aorta, without rupture: Secondary | ICD-10-CM | POA: Diagnosis not present

## 2021-12-13 DIAGNOSIS — I4891 Unspecified atrial fibrillation: Secondary | ICD-10-CM | POA: Diagnosis not present

## 2021-12-13 LAB — LIPID PANEL
Cholesterol: 137 mg/dL (ref 0–200)
HDL: 41.3 mg/dL (ref >39.00)
LDL Cholesterol: 77 mg/dL (ref 0–99)
NonHDL: 96.06
Total CHOL/HDL Ratio: 3
Triglycerides: 93 mg/dL (ref 0.0–149.0)
VLDL: 18.6 mg/dL (ref 0.0–40.0)

## 2021-12-13 LAB — CBC
HCT: 45.4 % (ref 39.0–52.0)
Hemoglobin: 15.2 g/dL (ref 13.0–17.0)
MCHC: 33.6 g/dL (ref 30.0–36.0)
MCV: 93.9 fl (ref 78.0–100.0)
Platelets: 194 10*3/uL (ref 150.0–400.0)
RBC: 4.84 Mil/uL (ref 4.22–5.81)
RDW: 13.5 % (ref 11.5–15.5)
WBC: 6.2 10*3/uL (ref 4.0–10.5)

## 2021-12-13 LAB — COMPREHENSIVE METABOLIC PANEL
ALT: 18 U/L (ref 0–53)
AST: 13 U/L (ref 0–37)
Albumin: 4.3 g/dL (ref 3.5–5.2)
Alkaline Phosphatase: 54 U/L (ref 39–117)
BUN: 18 mg/dL (ref 6–23)
CO2: 27 mEq/L (ref 19–32)
Calcium: 9.4 mg/dL (ref 8.4–10.5)
Chloride: 104 mEq/L (ref 96–112)
Creatinine, Ser: 0.94 mg/dL (ref 0.40–1.50)
GFR: 85.4 mL/min (ref >60.00)
Glucose, Bld: 90 mg/dL (ref 70–99)
Potassium: 4.2 mEq/L (ref 3.5–5.1)
Sodium: 138 mEq/L (ref 135–145)
Total Bilirubin: 0.8 mg/dL (ref 0.2–1.2)
Total Protein: 6.9 g/dL (ref 6.0–8.3)

## 2021-12-13 LAB — TSH: TSH: 1.26 u[IU]/mL (ref 0.35–5.50)

## 2021-12-13 LAB — HEMOGLOBIN A1C: Hgb A1c MFr Bld: 5.8 % (ref 4.6–6.5)

## 2021-12-13 MED ORDER — TAMSULOSIN HCL 0.4 MG PO CAPS: 0.4000 mg | ORAL_CAPSULE | Freq: Every day | ORAL | 1 refills | Status: DC

## 2021-12-13 NOTE — Assessment & Plan Note (Signed)
Patient working on dietary modifications.  He is working on a keto style diet.  Did offer to refer patient to healthy weight and wellness he would like to defer at the current juncture

## 2021-12-13 NOTE — Assessment & Plan Note (Signed)
Currently maintained on allopurinol 100 mg daily.  Colchicine as needed continue medication as prescribed

## 2021-12-13 NOTE — Assessment & Plan Note (Signed)
Patient currently monitored through cardiology.  Most recent CTA scan was 10/15/2021.  Showed no change at 4 cm

## 2021-12-13 NOTE — Assessment & Plan Note (Signed)
Experiencing some nocturia.  Last PSA 6 months ago within normal limits.  We will start patient on tamsulosin 0.4 mg nightly he will report back if this is beneficial

## 2021-12-13 NOTE — Assessment & Plan Note (Signed)
Patient followed by [facility] cardiology.  Patient currently on Xarelto and metoprolol continue taking medications as prescribed

## 2021-12-13 NOTE — Assessment & Plan Note (Signed)
Adherent to therapy per patient report.  Getting good rest.  Continue using medical device as directed

## 2021-12-13 NOTE — Patient Instructions (Signed)
Nice to see you today Continue working on the weight loss I sent in the tamsulosin to see if it helps with the urinating Follow up with me in 1 year, sooner if you need me

## 2021-12-13 NOTE — Progress Notes (Signed)
Established Patient Office Visit  Subjective   Patient ID: Tony Hampton, male    DOB: 08/02/1955  Age: 66 y.o. MRN: [id number]  Chief Complaint  Patient presents with   Annual Exam    HPI  HTN: checks blood pressure at home on occasion.  Patient currently maintained on amlodipine 10 mg and metoprolol 50 mg XR.  OSA: tolerates CPAP with 7-10 hours  Atrial fibrillation: Patient maintained on metoprolol for rate control and Xarelto for anticoagulation.  Gout: Patient currently maintained on allopurinol 100 mg daily and colchicine on a as needed basis.    for complete physical and follow up of chronic conditions.  Immunizations: -Tetanus: 2016 -Influenza: refused  -Shingles: refused -Pneumonia: Too young - Covid: refused  -HPV: Aged out  Diet: [place]. 3 times a day. Has been working carb reduction. Seldom snacking. Apple and almonds for snacks. Has salads with lean protein. Coffee and water Exercise: No regular exercise. 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 Walkin with [employer]. Lasix eye surgery. Approx 10 years  Dental exam: Completes semi-annually    Colonoscopy: Completed in 07/2021. Repeat in 2024.  Removed 13 polyps and sent for genetic testing Lung Cancer Screening: Does not qualify has been not smoking for greater than 15 years Dexa: N/A  PSA: Done 6 months ago within normal limits  Sleep: 7-10 hours. Feels rested. States that he nocturia 2 times a night.       Review of Systems  Constitutional:  Negative for chills and fever.  Respiratory:  Positive for shortness of breath (doe).   Cardiovascular:  Positive for leg swelling. Negative for chest pain.  Gastrointestinal:  Negative for abdominal pain, blood in stool, constipation, diarrhea, nausea and vomiting.       BM daily  Genitourinary:  Negative for dysuria and hematuria.  Neurological:  Negative for tingling and headaches.  Psychiatric/Behavioral:   Negative for hallucinations and suicidal ideas.       Objective:     BP 120/76   Pulse 70   Temp (!) 96.8 F (36 C) (Temporal)   Resp 16   Ht '6\' 2"'$  (1.88 m)   Wt (!) 384 lb 4 oz (174.3 kg)   SpO2 96%   BMI 49.33 kg/m  BP Readings from Last 3 Encounters:  12/13/21 120/76  07/31/21 118/69  06/13/21 118/62   Wt Readings from Last 3 Encounters:  12/13/21 (!) 384 lb 4 oz (174.3 kg)  07/31/21 (!) 375 lb (170.1 kg)  06/13/21 (!) 380 lb (172.4 kg)      Physical Exam Vitals and nursing note reviewed.  Constitutional:      Appearance: Normal appearance. He is obese.  HENT:     Right Ear: Tympanic membrane, ear canal and external ear normal.     Left Ear: Tympanic membrane, ear canal and external ear normal.     Mouth/Throat:     Mouth: Mucous membranes are moist.     Pharynx: Oropharynx is clear.  Eyes:     Extraocular Movements: Extraocular movements intact.     Pupils: Pupils are equal, round, and reactive to light.  Cardiovascular:     Rate and Rhythm: Normal rate and regular rhythm.     Pulses: Normal pulses.     Heart sounds: Normal heart sounds.  Pulmonary:     Effort: Pulmonary effort is normal.     Breath sounds: Normal breath sounds.  Abdominal:     General:  Bowel sounds are normal. There is no distension.     Palpations: There is no mass.     Tenderness: There is no abdominal tenderness.     Hernia: No hernia is present.  Genitourinary:    Comments: Deferred at patient request. Musculoskeletal:     Right lower leg: No edema.     Left lower leg: No edema.  Lymphadenopathy:     Cervical: No cervical adenopathy.  Skin:    General: Skin is warm.  Neurological:     General: No focal deficit present.     Mental Status: He is alert.     Comments: Bilateral upper and lower extremity 5/5  Psychiatric:        Mood and Affect: Mood normal.        Behavior: Behavior normal.        Thought Content: Thought content normal.        Judgment: Judgment normal.       No results found for any visits on 12/13/21.    The 10-year ASCVD risk score (Arnett DK, et al., 2019) is: 10.5%    Assessment & Plan:   Problem List Items Addressed This Visit       Cardiovascular and Mediastinum   Atrial fibrillation [facility])    Patient followed by [facility] cardiology.  Patient currently on Xarelto and metoprolol continue taking medications as prescribed      Essential (primary) hypertension    Currently maintained on amlodipine and metoprolol.  Blood pressure within normal limits continue medication as prescribed      Relevant Orders   CBC   Comprehensive metabolic panel   Hemoglobin A1c   TSH   Lipid panel   Aneurysm of ascending aorta without rupture [facility])    Patient currently monitored through cardiology.  Most recent CTA scan was 10/15/2021.  Showed no change at 4 cm        Respiratory   OSA on CPAP    Adherent to therapy per patient report.  Getting good rest.  Continue using medical device as directed        Other   Obesity    Patient working on dietary modifications.  He is working on a keto style diet.  Did offer to refer patient to healthy weight and wellness he would like to defer at the current juncture      Relevant Orders   Hemoglobin A1c   Lipid panel   Gout    Currently maintained on allopurinol 100 mg daily.  Colchicine as needed continue medication as prescribed      Nocturia    Experiencing some nocturia.  Last PSA 6 months ago within normal limits.  We will start patient on tamsulosin 0.4 mg nightly he will report back if this is beneficial      Relevant Medications   tamsulosin (FLOMAX) 0.4 MG CAPS capsule   Preventative health care - Primary    Discussed age-appropriate immunizations and screening exams.  Patient was given written materials at dismissal clinic for preventative healthcare maintenance during his age range.  This also has anticipatory guidance.      Relevant Orders   CBC   Comprehensive  metabolic panel   Hemoglobin A1c   TSH   Lipid panel    Return in about 1 year (around 12/14/2022) for CPE and labs.    Romilda Garret, NP

## 2021-12-13 NOTE — Assessment & Plan Note (Signed)
Discussed age-appropriate immunizations and screening exams.  Patient was given written materials at dismissal clinic for preventative healthcare maintenance during his age range.  This also has anticipatory guidance.

## 2021-12-13 NOTE — Assessment & Plan Note (Signed)
Currently maintained on amlodipine and metoprolol.  Blood pressure within normal limits continue medication as prescribed

## 2022-01-04 ENCOUNTER — Other Ambulatory Visit: Payer: Self-pay | Admitting: Nurse Practitioner

## 2022-01-04 DIAGNOSIS — R351 Nocturia: Secondary | ICD-10-CM

## 2022-01-04 NOTE — Telephone Encounter (Signed)
Mychart message sent to the patient to follow up if medication has been helpful

## 2022-01-05 NOTE — Telephone Encounter (Signed)
Left message to call back also

## 2022-01-05 NOTE — Telephone Encounter (Signed)
Also see lab result note thank you

## 2022-01-09 NOTE — Telephone Encounter (Signed)
Spoke with Mrs Fulfer, ok per DPR on file, patient has improved a lot with the medication and sleeps well now without going to the bathroom. Would like a refill please

## 2022-02-08 ENCOUNTER — Other Ambulatory Visit: Payer: Self-pay | Admitting: Podiatry

## 2022-02-14 ENCOUNTER — Telehealth: Payer: Self-pay | Admitting: Nurse Practitioner

## 2022-02-14 NOTE — Telephone Encounter (Signed)
Claiborne Billings from [facility] called and stated Dr Nehemiah Massed has retired and want to know if [facility] will take over sleep supplies. Call back number [phone].

## 2022-02-14 NOTE — Telephone Encounter (Signed)
That is fine 

## 2022-02-15 NOTE — Telephone Encounter (Signed)
Called and left a message for Claiborne Billings to give [place] a call back.

## 2022-02-15 NOTE — Telephone Encounter (Signed)
Kelly form [place] and was advised that [facility] will take over sleep supplies. Forms are being faxed over to the office.

## 2022-02-16 NOTE — Telephone Encounter (Signed)
Tony Hampton from [facility] called and stated they got the fax but she wanted to make sure [employer] got sent the fax as well, and their fax is  [phone]

## 2022-02-16 NOTE — Telephone Encounter (Signed)
Forms faxed to [place]

## 2022-02-25 ENCOUNTER — Telehealth: Payer: Medicare HMO | Admitting: Urgent Care

## 2022-02-25 DIAGNOSIS — J189 Pneumonia, unspecified organism: Secondary | ICD-10-CM

## 2022-02-25 MED ORDER — AMOXICILLIN-POT CLAVULANATE 875-125 MG PO TABS: 1.0000 | ORAL_TABLET | Freq: Two times a day (BID) | ORAL | 0 refills | Status: AC

## 2022-02-25 MED ORDER — DOXYCYCLINE HYCLATE 100 MG PO TABS: 100.0000 mg | ORAL_TABLET | Freq: Two times a day (BID) | ORAL | 0 refills | Status: AC

## 2022-02-25 MED ORDER — ALBUTEROL SULFATE HFA 108 (90 BASE) MCG/ACT IN AERS: 2.0000 | INHALATION_SPRAY | Freq: Four times a day (QID) | RESPIRATORY_TRACT | 0 refills | Status: DC | PRN

## 2022-02-25 NOTE — Patient Instructions (Signed)
Tony Hampton, thank you for joining Chaney Malling, PA for today's virtual visit.  While this provider is not your primary care provider (PCP), if your PCP is located in our provider database this encounter information will be shared with them immediately following your visit.   [facility] account gives you access to today's visit and all your visits, tests, and labs performed at [facility] " click here if you don't have a [facility] account or go to [email or web address]  Consent: (Patient) Tony Hampton provided verbal consent for this virtual visit at the beginning of the encounter.  Current Medications:  Current Outpatient Medications:    albuterol (VENTOLIN HFA) 108 (90 Base) MCG/ACT inhaler, Inhale 2 puffs into the lungs every 6 (six) hours as needed for wheezing or shortness of breath., Disp: 18 g, Rfl: 0   amoxicillin-clavulanate (AUGMENTIN) 875-125 MG tablet, Take 1 tablet by mouth 2 (two) times daily for 7 days., Disp: 14 tablet, Rfl: 0   doxycycline (VIBRA-TABS) 100 MG tablet, Take 1 tablet (100 mg total) by mouth 2 (two) times daily for 7 days., Disp: 14 tablet, Rfl: 0   allopurinol (ZYLOPRIM) 100 MG tablet, TAKE 1 TABLET BY MOUTH TWICE A DAY, Disp: 180 tablet, Rfl: 1   amLODipine (NORVASC) 10 MG tablet, Take 10 mg by mouth daily., Disp: , Rfl:    colchicine 0.6 MG tablet, TAKE TWO TABLETS BY MOUTH AT ONSET OF GOUT FOLLOWED BY 1 TABLET IN 2 HOURS AS NEEDED., Disp: 20 tablet, Rfl: 4   metoprolol succinate (TOPROL-XL) 50 MG 24 hr tablet, TAKE 1 AND 1/2 TABLETS BY MOUTH EVERY DAY, Disp: 135 tablet, Rfl: 0   tamsulosin (FLOMAX) 0.4 MG CAPS capsule, TAKE 1 CAPSULE BY MOUTH EVERY DAY, Disp: 90 capsule, Rfl: 1   XARELTO 20 MG TABS tablet, Take 20 mg by mouth daily., Disp: , Rfl:    Medications ordered in this encounter:  Meds ordered this encounter  Medications   amoxicillin-clavulanate (AUGMENTIN) 875-125 MG tablet    Sig: Take 1 tablet by mouth  2 (two) times daily for 7 days.    Dispense:  14 tablet    Refill:  0    Order Specific Question:   Supervising Provider    Answer:   Bari Mantis   albuterol (VENTOLIN HFA) 108 (90 Base) MCG/ACT inhaler    Sig: Inhale 2 puffs into the lungs every 6 (six) hours as needed for wheezing or shortness of breath.    Dispense:  18 g    Refill:  0    Order Specific Question:   Supervising Provider    Answer:   Chase Picket [id number]   doxycycline (VIBRA-TABS) 100 MG tablet    Sig: Take 1 tablet (100 mg total) by mouth 2 (two) times daily for 7 days.    Dispense:  14 tablet    Refill:  0    Order Specific Question:   Supervising Provider    Answer:   Chase Picket [id number]     *If you need refills on other medications prior to your next appointment, please contact your pharmacy*  Follow-Up: Call back or seek an in-person evaluation if the symptoms worsen or if the condition fails to improve as anticipated.  [place] [phone]  Other Instructions Please start taking doxycycline and Augmentin twice daily. Follow package insert. Use 2 puffs of your albuterol every 4-6 hours as needed for wheezing. Continue tylenol and mucinex.  Rest and increase water intake. If any new or worsening symptoms, please head to an in person urgent care   If you have been instructed to have an in-person evaluation today at a local Urgent Care facility, please use the link below. It will take you to a list of all of our available [facility], including address, phone number and hours of operation. Please do not delay care.  [facility]  If you or a family member do not have a primary care provider, use the link below to schedule a visit and establish care. When you choose a [facility] primary care physician or advanced practice provider, you gain a long-term partner in health. Find a Primary Care Provider  Learn more about [facility]'s  in-office and virtual care options: [facility]

## 2022-02-25 NOTE — Progress Notes (Signed)
Virtual Visit Consent   Tony Hampton, you are scheduled for a virtual visit with a [facility] provider today. Just as with appointments in the office, your consent must be obtained to participate. Your consent will be active for this visit and any virtual visit you may have with one of our providers in the next 365 days. If you have a MyChart account, a copy of this consent can be sent to you electronically.  As this is a virtual visit, video technology does not allow for your provider to perform a traditional examination. This may limit your provider's ability to fully assess your condition. If your provider identifies any concerns that need to be evaluated in person or the need to arrange testing (such as labs, EKG, etc.), we will make arrangements to do so. Although advances in technology are sophisticated, we cannot ensure that it will always work on either your end or our end. If the connection with a video visit is poor, the visit may have to be switched to a telephone visit. With either a video or telephone visit, we are not always able to ensure that we have a secure connection.  By engaging in this virtual visit, you consent to the provision of healthcare and authorize for your insurance to be billed (if applicable) for the services provided during this visit. Depending on your insurance coverage, you may receive a charge related to this service.  I need to obtain your verbal consent now. Are you willing to proceed with your visit today? Tony Hampton has provided verbal consent on 02/25/2022 for a virtual visit (video or telephone). Tony Malling, PA  Date: 02/25/2022 8:58 AM  Virtual Visit via Video Note   I, Tony Hampton, connected with  Tony Hampton  ([id number], 04-30-1955) on 02/25/22 at  8:45 AM EST by a video-enabled telemedicine application and verified that I am speaking with the correct person using two identifiers.  Location: Patient: Virtual Visit Location Patient:  Home Provider: Virtual Visit Location Provider: Home Office   I discussed the limitations of evaluation and management by telemedicine and the availability of in person appointments. The patient expressed understanding and agreed to proceed.    History of Present Illness: Tony Hampton is a 66 y.o. who identifies as a male who was assigned male at birth, and is being seen today for cough and fever.  HPI: 66yo male with known hx of asthma and OSA presents today due to cough and fever. Pt started having a fever Friday around 4pm - 101 tmax, this morning 99. Pt admits to a harsh deep cough productive of rust colored sputum. He reports discomfort to substernal region while coughing. Has been taking OTC mucinex and tylenol which has helped with his symptoms. He denies body aches. He denies lethargy. He denies headache, rhinorrhea, sinus pain or other URI sx. He has an inhaler, but it expired in August so has not tried it. He admits to feeling "wheezy and tight." No CP or palps.   Problems:  Patient Active Problem List   Diagnosis Date Noted   Nocturia 12/13/2021   Preventative health care 12/13/2021   History of colonic polyps    Polyp of colon    Other fatigue 06/13/2021   Lower extremity edema 06/13/2021   Aneurysm of ascending aorta without rupture ([place]) 06/13/2021   Aortic atherosclerosis ([place]) 06/13/2021   OSA on CPAP 06/13/2021   Cough due to bronchospasm 03/16/2021   Rhinorrhea 03/16/2021  Sore throat 03/16/2021   Moderate mitral insufficiency 02/22/2016   Low testosterone in male 01/09/2016   Decreased libido 12/30/2015   Gout 12/30/2015   Atrial fibrillation ([place]) 12/13/2014   Cluster headache, episodic 12/13/2014   Family history of gout 12/13/2014   Essential (primary) hypertension 12/13/2014   Obesity 12/13/2014   H/O total knee replacement 04/27/2014   Arthritis of knee, degenerative 03/30/2014   Compulsive tobacco user syndrome 02/27/1968    Allergies: No Known  Allergies Medications:  Current Outpatient Medications:    albuterol (VENTOLIN HFA) 108 (90 Base) MCG/ACT inhaler, Inhale 2 puffs into the lungs every 6 (six) hours as needed for wheezing or shortness of breath., Disp: 18 g, Rfl: 0   amoxicillin-clavulanate (AUGMENTIN) 875-125 MG tablet, Take 1 tablet by mouth 2 (two) times daily for 7 days., Disp: 14 tablet, Rfl: 0   doxycycline (VIBRA-TABS) 100 MG tablet, Take 1 tablet (100 mg total) by mouth 2 (two) times daily for 7 days., Disp: 14 tablet, Rfl: 0   allopurinol (ZYLOPRIM) 100 MG tablet, TAKE 1 TABLET BY MOUTH TWICE A DAY, Disp: 180 tablet, Rfl: 1   amLODipine (NORVASC) 10 MG tablet, Take 10 mg by mouth daily., Disp: , Rfl:    colchicine 0.6 MG tablet, TAKE TWO TABLETS BY MOUTH AT ONSET OF GOUT FOLLOWED BY 1 TABLET IN 2 HOURS AS NEEDED., Disp: 20 tablet, Rfl: 4   metoprolol succinate (TOPROL-XL) 50 MG 24 hr tablet, TAKE 1 AND 1/2 TABLETS BY MOUTH EVERY DAY, Disp: 135 tablet, Rfl: 0   tamsulosin (FLOMAX) 0.4 MG CAPS capsule, TAKE 1 CAPSULE BY MOUTH EVERY DAY, Disp: 90 capsule, Rfl: 1   XARELTO 20 MG TABS tablet, Take 20 mg by mouth daily., Disp: , Rfl:   Observations/Objective: Patient is well-developed, well-nourished in no acute distress.  Resting comfortably at home. Pt standing without notable weakness Head is normocephalic, atraumatic.  No labored breathing. Harsh, wheezy cough heard intermittently. No accessory muscle usage. Speech is clear and coherent with logical content.  Patient is alert and oriented at baseline.  No rhinorrhea or sinus tenderness. Thick brown mucous seen on tissue  Assessment and Plan: 1. Community acquired pneumonia, unspecified laterality  Pt sx most concerning for strep pneumonia given presentation and risk factors. Pt fever appears to be breaking. Was initially going to do Augmentin/Azithro, however zpack interferes with pt's colchicine therefore will have to switch to doxycycline. Will refill pt's inhaler.  Strict ER precautions discussed  Follow Up Instructions: I discussed the assessment and treatment plan with the patient. The patient was provided an opportunity to ask questions and all were answered. The patient agreed with the plan and demonstrated an understanding of the instructions.  A copy of instructions were sent to the patient via MyChart unless otherwise noted below.    The patient was advised to call back or seek an in-person evaluation if the symptoms worsen or if the condition fails to improve as anticipated.  Time:  I spent 9 minutes with the patient via telehealth technology discussing the above problems/concerns.    [place], PA

## 2022-03-01 ENCOUNTER — Ambulatory Visit (INDEPENDENT_AMBULATORY_CARE_PROVIDER_SITE_OTHER): Payer: Medicare HMO | Admitting: Nurse Practitioner

## 2022-03-01 ENCOUNTER — Encounter: Payer: Self-pay | Admitting: Nurse Practitioner

## 2022-03-01 VITALS — BP 122/78 | HR 80 | Ht 74.0 in | Wt 384.0 lb

## 2022-03-01 DIAGNOSIS — R051 Acute cough: Secondary | ICD-10-CM | POA: Diagnosis not present

## 2022-03-01 DIAGNOSIS — R55 Syncope and collapse: Secondary | ICD-10-CM | POA: Diagnosis not present

## 2022-03-01 DIAGNOSIS — J189 Pneumonia, unspecified organism: Secondary | ICD-10-CM | POA: Diagnosis not present

## 2022-03-01 DIAGNOSIS — R0609 Other forms of dyspnea: Secondary | ICD-10-CM | POA: Diagnosis not present

## 2022-03-01 MED ORDER — PREDNISONE 20 MG PO TABS: ORAL_TABLET | ORAL | 0 refills | Status: AC

## 2022-03-01 MED ORDER — BENZONATATE 200 MG PO CAPS: 200.0000 mg | ORAL_CAPSULE | Freq: Three times a day (TID) | ORAL | 0 refills | Status: DC | PRN

## 2022-03-01 NOTE — Assessment & Plan Note (Signed)
Tessalon Perles as needed.

## 2022-03-01 NOTE — Assessment & Plan Note (Signed)
Patient short of breath on exertion. Has some wheezing. Will treat with prednisone.  Prednisone precautions reviewed

## 2022-03-01 NOTE — Assessment & Plan Note (Signed)
Patient was seen via telemedicine and prescribed doxycycline and Augmentin for community-acquired pneumonia.  He can continue medication states he has about 3 days left and has been improving.  Will do a follow-up chest x-ray in 4 weeks

## 2022-03-01 NOTE — Progress Notes (Signed)
Acute Office Visit  Subjective:     Patient ID: Tony Hampton, male    DOB: [birth year], 67 y.o.   MRN: [id number]  Chief Complaint  Patient presents with   Cough     Patient is in today for cough  Has a history of HTN, A fib, OSA  Symptoms started last Friday States no sick contacts [place] No covid test No coivd No flu vaccine States he has been using mucous relief. Has been taking the augmentin doxy and alubiterol inhaler. States that the cough is worse at night  Has SHOB with DOE. State sthat he has used the albuterol every 6 ours and not seeing a huge difference  States that he was laying down and was coughing real bad. States that he had pressure in his head that he just fell against his spouse who was in the bed.  States lasted for a couple seconds and he is getting back to.  Thinks that he lost consciousness. Review of Systems  Constitutional:  Positive for fever and malaise/fatigue. Negative for chills.  Respiratory:  Positive for cough, sputum production and shortness of breath.   Gastrointestinal:  Positive for diarrhea (loose stool). Negative for abdominal pain, nausea and vomiting.  Musculoskeletal:  Negative for joint pain and myalgias.  Neurological:  Positive for headaches.        Objective:    BP 122/78   Pulse 80   Ht '6\' 2"'$  (1.88 m)   Wt (!) 384 lb (174.2 kg)   SpO2 93%   BMI 49.30 kg/m    Physical Exam Vitals reviewed.  Constitutional:      Appearance: Normal appearance. He is obese.  HENT:     Mouth/Throat:     Mouth: Mucous membranes are moist.     Pharynx: Oropharynx is clear. No posterior oropharyngeal erythema.  Cardiovascular:     Rate and Rhythm: Normal rate and regular rhythm.     Heart sounds: Normal heart sounds.  Pulmonary:     Effort: Pulmonary effort is normal.     Breath sounds: Wheezing present.  Lymphadenopathy:     Cervical: No cervical adenopathy.  Neurological:     Mental Status: He is  alert.     No results found for any visits on 03/01/22.      Assessment & Plan:   Problem List Items Addressed This Visit       Respiratory   Community acquired pneumonia    Patient was seen via telemedicine and prescribed doxycycline and Augmentin for community-acquired pneumonia.  He can continue medication states he has about 3 days left and has been improving.  Will do a follow-up chest x-ray in 4 weeks      Relevant Medications   benzonatate (TESSALON) 200 MG capsule   Other Relevant Orders   DG Chest 2 View     Other   DOE (dyspnea on exertion) - Primary    Patient short of breath on exertion. Has some wheezing. Will treat with prednisone.  Prednisone precautions reviewed      Relevant Medications   predniSONE (DELTASONE) 20 MG tablet   Acute cough    Tessalon Perles as needed.      Vasovagal syncope    Patient had a coughing fit which caused him to pass out.  Most consistent with vasovagal syncope.       Meds ordered this encounter  Medications   predniSONE (DELTASONE) 20 MG tablet    Sig:  Take 1 tablet (20 mg total) by mouth 2 (two) times daily with a meal for 4 days, THEN 1 tablet (20 mg total) daily with breakfast for 4 days. Avoid NSAIDs like: ibuprofen, aleve, naproxen, motrin, BC/Goody powders.    Dispense:  12 tablet    Refill:  0    Order Specific Question:   Supervising Provider    Answer:   Glori Bickers MARNE A [[id number]]   benzonatate (TESSALON) 200 MG capsule    Sig: Take 1 capsule (200 mg total) by mouth 3 (three) times daily as needed for cough.    Dispense:  21 capsule    Refill:  0    Order Specific Question:   Supervising Provider    Answer:   TOWER, MARNE A [[id number]]    Return if symptoms worsen or fail to improve.  Romilda Garret, NP

## 2022-03-01 NOTE — Assessment & Plan Note (Signed)
Patient had a coughing fit which caused him to pass out.  Most consistent with vasovagal syncope.

## 2022-03-01 NOTE — Patient Instructions (Signed)
Nice to see you today Let me know if you do not improve I have sent in prednisone to the pharmacy

## 2022-03-29 ENCOUNTER — Ambulatory Visit (INDEPENDENT_AMBULATORY_CARE_PROVIDER_SITE_OTHER)
Admission: RE | Admit: 2022-03-29 | Discharge: 2022-03-29 | Disposition: A | Discharge status: Home / Self Care | Payer: Medicare HMO | Source: Ambulatory Visit | Attending: Nurse Practitioner | Admitting: Nurse Practitioner

## 2022-03-29 DIAGNOSIS — J189 Pneumonia, unspecified organism: Secondary | ICD-10-CM | POA: Diagnosis not present

## 2022-03-29 DIAGNOSIS — J9811 Atelectasis: Secondary | ICD-10-CM | POA: Diagnosis not present

## 2022-05-01 ENCOUNTER — Telehealth: Payer: Self-pay

## 2022-05-01 NOTE — Telephone Encounter (Signed)
Pt has an appointment tomorrow 05/02/22 with Dr. Lorelei Pont.    [facility] - Client TELEPHONE ADVICE RECORD [facility] Patient Name: Tony Hampton Gender: Male DOB: Jul 12, 1955 Age: 67 Y 93 M 4 D Return Phone Number: [phone] (Primary), [phone] (Secondary) Address: City/ State/ Zip: [place]  [address] Client [facility] - Client Client Site [facility] Day Provider Romilda Garret- NP Contact Type Call Who Is Calling Patient / Member / Family / Caregiver Call Type Triage / Clinical Caller Name Horris Latino Relationship To Patient Spouse Return Phone Number [phone] (Primary) Chief Complaint Feet swelling Reason for Call Symptomatic / Request for Tony Hampton states he is having feet swelling. Translation No Nurse Assessment Nurse: Nicki Reaper, RN, Malachy Mood Date/Time (Eastern Time): 05/01/2022 12:06:53 PM Confirm and document reason for call. If symptomatic, describe symptoms. ---Caller states her husband has swelling to L leg from knee down and has a little swelling in R knee that started a couple of days ago, is using compression stockings, has some SOB comes and goes, no SOB now, denies leg pain, CP and other symptoms, Does the patient have any new or worsening symptoms? ---Yes Will a triage be completed? ---Yes Related visit to physician within the last 2 weeks? ---No Does the PT have any chronic conditions? (i.e. diabetes, asthma, this includes High risk factors for pregnancy, etc.) ---Yes List chronic conditions. ---HTN Is this a behavioral health or substance abuse call? ---No Guidelines Guideline Title Affirmed Question Affirmed Notes Nurse Date/Time Eilene Ghazi Time) Leg Swelling and Edema [1] Thigh, calf, or ankle swelling AND [2] bilateral AND [3] 1 side is more swollen Nicki Reaper, RN, Malachy Mood 05/01/2022 12:09:25 PM Disp. Time Eilene Ghazi Time) Disposition Final  User 05/01/2022 11:52:24 AM Attempt made - message left Nicki Reaper, RN, Malachy Mood PLEASE NOTE: All timestamps contained within this report are represented as [place] Standard Time. CONFIDENTIALTY NOTICE: This fax transmission is intended only for the addressee. It contains information that is legally privileged, confidential or otherwise protected from use or disclosure. If you are not the intended recipient, you are strictly prohibited from reviewing, disclosing, copying using or disseminating any of this information or taking any action in reliance on or regarding this information. If you have received this fax in error, please notify us immediately by telephone so that we can arrange for its return to [place]. Phone: [phone], Toll-Free: [phone], Fax: [phone] Page: 2 of 2 Call Id: [id number] [place]. Time Eilene Ghazi Time) Disposition Final User 05/01/2022 12:13:25 PM See HCP within 4 Hours (or PCP triage) Yes Nicki Reaper, RN, Malachy Mood Final Disposition 05/01/2022 12:13:25 PM See HCP within 4 Hours (or PCP triage) Yes Nicki Reaper, RN, Erskine Speed Disagree/Comply Disagree Caller Understands Yes PreDisposition Call Doctor Care Advice Given Per Guideline SEE HCP (OR PCP TRIAGE) WITHIN 4 HOURS: * IF OFFICE WILL BE OPEN: You need to be seen within the next 3 or 4 hours. Call your doctor (or NP/PA) now or as soon as the office opens. CALL EMS IF: Comments User: Burna Sis, RN Date/Time Eilene Ghazi Time): 05/01/2022 11:52:41 AM Left message on primary and secondary phone numbers User: Burna Sis, RN Date/Time Eilene Ghazi Time): 05/01/2022 12:15:56 PM Refused to be seen today, has another appointment with a family member, requesting appt tomorrow, transferred to office and instructed to let them know Usnea recommended to be seen in next 3-4 hrs and he is unable to be seen, Referrals GO TO FACILITY REFUSED

## 2022-05-01 NOTE — Progress Notes (Signed)
Tony Hampton T. Melik Blancett, MD, CAQ Sports Medicine [facility] [address] [place], [address]  Phone: [phone]  FAX: [phone]  Tony Hampton - 67 y.o. male  MRN [id number]  Date of Birth: 12-08-1955  Date: 05/02/2022  PCP: Tony Emms, NP  Referral: Tony Emms, NP  Chief Complaint  Patient presents with   Foot Swelling   Subjective:   Tony Hampton is a 67 y.o. very pleasant male patient with Body mass index is 50.07 kg/m. who presents with the following:  The patient presents with swelling in his legs or feet, and he was referred by access nurse and RN triage to me.  Shortness of breath with activity.  He is getting short of breath with minimal exertion right now, and this has been going on at least on the order of months.  He feels like this might be getting worse.  He is having bilateral lower extremity swelling to the point of the knee.  While he does occasionally have some swelling, this has been worse a lot over the last week or so.  Left side is slightly worse than the right.  He does have indenting lower extremity edema.  He has not tried anything at home.  Eats a lot of salmon and chicken.  He wonders why he gains weight.  Wt Readings from Last 3 Encounters:  05/02/22 (!) 390 lb (176.9 kg)  03/01/22 (!) 384 lb (174.2 kg)  12/13/21 (!) 384 lb 4 oz (174.3 kg)      Review of Systems is noted in the HPI, as appropriate  Objective:   BP 104/70   Pulse (!) 53   Temp 97.8 F (36.6 C) (Temporal)   Ht 6\' 2"  (1.88 m)   Wt (!) 390 lb (176.9 kg)   SpO2 94%   BMI 50.07 kg/m   GEN: No acute distress; alert,appropriate. PULM: Breathing comfortably in no respiratory distress PSYCH: Normally interactive.  CV: RRR, no m/g/r     2+ lower extremity edema to the knee.  Laboratory and Imaging Data:  Assessment and Plan:     ICD-10-CM   1. DOE (dyspnea on exertion)  R06.09 Brain natriuretic peptide    Basic  metabolic panel    CBC with Differential/Platelet    Hepatic function panel    2. Lower extremity edema  R60.0 Brain natriuretic peptide    Basic metabolic panel    CBC with Differential/Platelet    Hepatic function panel     Worsening shortness of breath with lower extremity edema.  He did have a cardiac BNP of 205 10 months ago, so I think that we need to reassess this for potential heart failure.  I am going to give him some Lasix to take over the next few days to assist with diuresis.  Additionally obtained labs to assure normal renal and liver function.  Medication Management during today's office visit: Meds ordered this encounter  Medications   furosemide (LASIX) 20 MG tablet    Sig: Take 1 tablet (20 mg total) by mouth daily.    Dispense:  5 tablet    Refill:  0   Medications Discontinued During This Encounter  Medication Reason   benzonatate (TESSALON) 200 MG capsule Completed Course    Orders placed today for conditions managed today: Orders Placed This Encounter  Procedures   Brain natriuretic peptide   Basic metabolic panel   CBC with Differential/Platelet   Hepatic function panel  Disposition: No follow-ups on file.  [employer] One speech-to-text software was used for transcription in this dictation.  Possible transcriptional errors can occur using Animal nutritionist.   Signed,  Elpidio Galea. Winfrey Chillemi, MD   Outpatient Encounter Medications as of 05/02/2022  Medication Sig   albuterol (VENTOLIN HFA) 108 (90 Base) MCG/ACT inhaler Inhale 2 puffs into the lungs every 6 (six) hours as needed for wheezing or shortness of breath.   allopurinol (ZYLOPRIM) 100 MG tablet TAKE 1 TABLET BY MOUTH TWICE A DAY   amLODipine (NORVASC) 10 MG tablet Take 10 mg by mouth daily.   colchicine 0.6 MG tablet TAKE TWO TABLETS BY MOUTH AT ONSET OF GOUT FOLLOWED BY 1 TABLET IN 2 HOURS AS NEEDED.   furosemide (LASIX) 20 MG tablet Take 1 tablet (20 mg total) by mouth daily.    metoprolol succinate (TOPROL-XL) 50 MG 24 hr tablet TAKE 1 AND 1/2 TABLETS BY MOUTH EVERY DAY   tamsulosin (FLOMAX) 0.4 MG CAPS capsule TAKE 1 CAPSULE BY MOUTH EVERY DAY   XARELTO 20 MG TABS tablet Take 20 mg by mouth daily.   [DISCONTINUED] benzonatate (TESSALON) 200 MG capsule Take 1 capsule (200 mg total) by mouth 3 (three) times daily as needed for cough.   No facility-administered encounter medications on file as of 05/02/2022.

## 2022-05-02 ENCOUNTER — Encounter: Payer: Self-pay | Admitting: Family Medicine

## 2022-05-02 ENCOUNTER — Ambulatory Visit (INDEPENDENT_AMBULATORY_CARE_PROVIDER_SITE_OTHER): Payer: Medicare HMO | Admitting: Family Medicine

## 2022-05-02 VITALS — BP 104/70 | HR 53 | Temp 97.8°F | Ht 74.0 in | Wt 390.0 lb

## 2022-05-02 DIAGNOSIS — R0609 Other forms of dyspnea: Secondary | ICD-10-CM

## 2022-05-02 DIAGNOSIS — R600 Localized edema: Secondary | ICD-10-CM

## 2022-05-02 LAB — CBC WITH DIFFERENTIAL/PLATELET
Basophils Absolute: 0 10*3/uL (ref 0.0–0.1)
Basophils Relative: 0.3 % (ref 0.0–3.0)
Eosinophils Absolute: 0.2 10*3/uL (ref 0.0–0.7)
Eosinophils Relative: 2.8 % (ref 0.0–5.0)
HCT: 44.5 % (ref 39.0–52.0)
Hemoglobin: 14.7 g/dL (ref 13.0–17.0)
Lymphocytes Relative: 18.4 % (ref 12.0–46.0)
Lymphs Abs: 1.1 10*3/uL (ref 0.7–4.0)
MCHC: 33 g/dL (ref 30.0–36.0)
MCV: 92.3 fl (ref 78.0–100.0)
Monocytes Absolute: 0.8 10*3/uL (ref 0.1–1.0)
Monocytes Relative: 13.4 % — ABNORMAL HIGH (ref 3.0–12.0)
Neutro Abs: 3.9 10*3/uL (ref 1.4–7.7)
Neutrophils Relative %: 65.1 % (ref 43.0–77.0)
Platelets: 187 10*3/uL (ref 150.0–400.0)
RBC: 4.82 Mil/uL (ref 4.22–5.81)
RDW: 14.5 % (ref 11.5–15.5)
WBC: 6.1 10*3/uL (ref 4.0–10.5)

## 2022-05-02 LAB — HEPATIC FUNCTION PANEL
ALT: 12 U/L (ref 0–53)
AST: 11 U/L (ref 0–37)
Albumin: 3.9 g/dL (ref 3.5–5.2)
Alkaline Phosphatase: 52 U/L (ref 39–117)
Bilirubin, Direct: 0.2 mg/dL (ref 0.0–0.3)
Total Bilirubin: 1.1 mg/dL (ref 0.2–1.2)
Total Protein: 6.9 g/dL (ref 6.0–8.3)

## 2022-05-02 LAB — BASIC METABOLIC PANEL
BUN: 20 mg/dL (ref 6–23)
CO2: 28 mEq/L (ref 19–32)
Calcium: 9.5 mg/dL (ref 8.4–10.5)
Chloride: 104 mEq/L (ref 96–112)
Creatinine, Ser: 0.96 mg/dL (ref 0.40–1.50)
GFR: 83.04 mL/min (ref >60.00)
Glucose, Bld: 104 mg/dL — ABNORMAL HIGH (ref 70–99)
Potassium: 3.9 mEq/L (ref 3.5–5.1)
Sodium: 139 mEq/L (ref 135–145)

## 2022-05-02 LAB — BRAIN NATRIURETIC PEPTIDE: Pro B Natriuretic peptide (BNP): 389 pg/mL — ABNORMAL HIGH (ref 0.0–100.0)

## 2022-05-02 MED ORDER — FUROSEMIDE 20 MG PO TABS: 20.0000 mg | ORAL_TABLET | Freq: Every day | ORAL | 0 refills | Status: DC

## 2022-05-02 NOTE — Telephone Encounter (Signed)
Noted. Appreciate Dr. Lorelei Pont evaluating patient

## 2022-05-03 ENCOUNTER — Encounter: Payer: Self-pay | Admitting: Family Medicine

## 2022-05-03 DIAGNOSIS — I5021 Acute systolic (congestive) heart failure: Secondary | ICD-10-CM

## 2022-05-03 DIAGNOSIS — R0609 Other forms of dyspnea: Secondary | ICD-10-CM

## 2022-05-03 DIAGNOSIS — R7989 Other specified abnormal findings of blood chemistry: Secondary | ICD-10-CM

## 2022-05-04 ENCOUNTER — Encounter: Payer: Self-pay | Admitting: Cardiology

## 2022-05-04 ENCOUNTER — Ambulatory Visit: Payer: Medicare HMO | Attending: Cardiology | Admitting: Cardiology

## 2022-05-04 VITALS — BP 110/70 | HR 61 | Ht 75.0 in | Wt 385.8 lb

## 2022-05-04 DIAGNOSIS — I10 Essential (primary) hypertension: Secondary | ICD-10-CM | POA: Diagnosis not present

## 2022-05-04 DIAGNOSIS — R600 Localized edema: Secondary | ICD-10-CM | POA: Diagnosis not present

## 2022-05-04 DIAGNOSIS — I77810 Thoracic aortic ectasia: Secondary | ICD-10-CM | POA: Diagnosis not present

## 2022-05-04 DIAGNOSIS — I4821 Permanent atrial fibrillation: Secondary | ICD-10-CM

## 2022-05-04 MED ORDER — LOSARTAN POTASSIUM 50 MG PO TABS: 50.0000 mg | ORAL_TABLET | Freq: Every day | ORAL | 3 refills | Status: DC

## 2022-05-04 MED ORDER — FUROSEMIDE 20 MG PO TABS: 20.0000 mg | ORAL_TABLET | Freq: Every day | ORAL | 3 refills | Status: DC | PRN

## 2022-05-04 NOTE — Patient Instructions (Signed)
Medication Instructions:   STOP Amlodipine  START Losartan - take one tablet (50 mg) by mouth daily.   *If you need a refill on your cardiac medications before your next appointment, please call your pharmacy*   Lab Work:  None Ordered  If you have labs (blood work) drawn today and your tests are completely normal, you will receive your results only by: [place] (if you have MyChart) OR A paper copy in the mail If you have any lab test that is abnormal or we need to change your treatment, we will call you to review the results.   Testing/Procedures:  Your physician has requested that you have an echocardiogram. Echocardiography is a painless test that uses sound waves to create images of your heart. It provides your doctor with information about the size and shape of your heart and how well your heart's chambers and valves are working. This procedure takes approximately one hour. There are no restrictions for this procedure. Please do NOT wear cologne, perfume, aftershave, or lotions (deodorant is allowed). Please arrive 15 minutes prior to your appointment time.  2. Your physician has requested that you have a lower extremity venous duplex. This test is an ultrasound of the veins in the legs or arms. It looks at venous blood flow that carries blood from the heart to the legs or arms. Allow one hour for a Lower Venous exam. Allow thirty minutes for an Upper Venous exam. There are no restrictions or special instructions.  Follow-Up: At [facility], you and your health needs are our priority.  As part of our continuing mission to provide you with exceptional heart care, we have created designated Provider Care Teams.  These Care Teams include your primary Cardiologist (physician) and Advanced Practice Providers (APPs -  Physician Assistants and Nurse Practitioners) who all work together to provide you with the care you need, when you need it.  We recommend signing up for  the patient portal called "MyChart".  Sign up information is provided on this After Visit Summary.  MyChart is used to connect with patients for Virtual Visits (Telemedicine).  Patients are able to view lab/test results, encounter notes, upcoming appointments, etc.  Non-urgent messages can be sent to your provider as well.   To learn more about what you can do with MyChart, go to [email or web address].    Your next appointment:   8 week(s)  Provider:   You may see Kate Sable, MD or one of the following Advanced Practice Providers on your designated Care Team:   Murray Hodgkins, NP Christell Faith, PA-C Cadence Kathlen Mody, PA-C Gerrie Nordmann, NP

## 2022-05-04 NOTE — Progress Notes (Signed)
Cardiology Office Note:    Date:  05/04/2022   ID:  Pamala Hurry, DOB March 27, 1955, MRN [id number]  PCP:  Michela Pitcher, NP   [place] Providers Cardiologist:  Kate Sable, MD     Referring MD: Owens Loffler, MD   Chief Complaint  Patient presents with   Acute systolic heart failure    BNP elevated levels. Patient states that he feels fine on today with no complaints. Tuesday, the patient legs was extremely swollen.  Meds reviewed.    History of Present Illness:    SEYDINA BAALMAN is a 67 y.o. male with a hx of hypertension, atrial fibrillation, OSA, morbid obesity who presented due to leg edema.  Previously seen by [facility] cardiology from a cardiac perspective.  States having leg edema ongoing for several weeks now.  Saw PCP, was given as needed Lasix, edema seems improved today.  Denies chest pain, palpitations.  Compliant with Xarelto, metoprolol as prescribed.  Has been on Norvasc for several years.  BP adequately controlled.  He is a Administrator.  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 Marry Guan   TONSILLECTOMY      Current Medications: Current Meds  Medication Sig   albuterol (VENTOLIN HFA) 108 (90 Base) MCG/ACT inhaler Inhale 2 puffs into the lungs every 6 (six) hours as needed for wheezing or shortness of breath.   allopurinol (ZYLOPRIM) 100 MG tablet TAKE 1 TABLET BY MOUTH TWICE A DAY   colchicine 0.6 MG tablet TAKE TWO TABLETS BY MOUTH AT ONSET OF GOUT FOLLOWED BY 1 TABLET  IN 2 HOURS AS NEEDED.   losartan (COZAAR) 50 MG tablet Take 1 tablet (50 mg total) by mouth daily.   metoprolol succinate (TOPROL-XL) 50 MG 24 hr tablet TAKE 1 AND 1/2 TABLETS BY MOUTH EVERY DAY   tamsulosin (FLOMAX) 0.4 MG CAPS capsule TAKE 1 CAPSULE BY MOUTH EVERY DAY   XARELTO 20 MG TABS tablet Take 20 mg by mouth daily.   [DISCONTINUED] amLODipine (NORVASC) 10 MG tablet Take 10 mg by mouth daily.   [DISCONTINUED] furosemide (LASIX) 20 MG tablet Take 1 tablet (20 mg total) by mouth daily.     Allergies:   Patient has no known allergies.   Social History   Socioeconomic History   Marital status: Married    Spouse name: Not on file   Number of children: Not on file   Years of education: Not on file   Highest education level: Not on file  Occupational History   Not on file  Tobacco Use   Smoking status: Former    Packs/day: 1.00    Years: 25.00    Total pack years: 25.00    Types: Cigarettes    Quit date: 2008    Years since quitting: 16.1   Smokeless tobacco: Never  Vaping Use   Vaping Use: Never used  Substance and Sexual Activity   Alcohol use: Yes    Comment: OCCASIONALLY. Beer  once everyother week   Drug use: No   Sexual activity: Not on file  Other Topics Concern   Not on file  Social History Narrative   Not on file   Social Determinants of Health   Financial Resource Strain: Not on file  Food Insecurity: Not on file  Transportation Needs: Not on file  Physical Activity: Not on file  Stress: Not on file  Social Connections: Not on file     Family History: The patient's family history includes Asthma in his mother; Breast cancer in his maternal grandmother; Breast cancer (age of onset: 43) in his sister; Diabetes in his father; Gallstones in his father; Hypertension in his father and mother; Other in his mother.  ROS:   Please see the history of present illness.     All other systems reviewed and are negative.  EKGs/Labs/Other Studies Reviewed:    The  following studies were reviewed today:   EKG:  EKG is  ordered today.  The ekg ordered today demonstrates atrial fibrillation, heart rate 61  Recent Labs: 06/13/2021: Brain Natriuretic Peptide 205 12/13/2021: TSH 1.26 05/02/2022: ALT 12; BUN 20; Creatinine, Ser 0.96; Hemoglobin 14.7; Platelets 187.0; Potassium 3.9; Pro B Natriuretic peptide (BNP) 389.0; Sodium 139  Recent Lipid Panel    Component Value Date/Time   CHOL 137 12/13/2021 1026   TRIG 93.0 12/13/2021 1026   HDL 41.30 12/13/2021 1026   CHOLHDL 3 12/13/2021 1026   VLDL 18.6 12/13/2021 1026   LDLCALC 77 12/13/2021 1026   LDLCALC 71 06/13/2021 0931     Risk Assessment/Calculations:             Physical Exam:    VS:  BP 110/70 (BP Location: Left Arm, Patient Position: Sitting, Cuff Size: Large)   Pulse 61   Ht '6\' 3"'$  (1.905 m)   Wt (!) 385 lb 12.8 oz (175 kg)   SpO2 93%   BMI 48.22 kg/m     Wt Readings from Last 3 Encounters:  05/04/22 (!) 385 lb 12.8 oz (175 kg)  05/02/22 (!) 390 lb (176.9 kg)  03/01/22 (!) 384 lb (174.2 kg)     GEN:  Well nourished, well developed in no acute distress HEENT: Normal NECK: No JVD; No carotid bruits CARDIAC: Irregular irregular RESPIRATORY:  Clear to auscultation without rales, wheezing or rhonchi  ABDOMEN: Soft, non-tender, non-distended MUSCULOSKELETAL:  1+ edema; No deformity  SKIN: Warm and dry NEUROLOGIC:  Alert and oriented x 3 PSYCHIATRIC:  Normal affect   ASSESSMENT:    1. Permanent atrial fibrillation ([place])   2. Primary hypertension   3. Bilateral leg edema   4. Ascending aorta dilatation (HCC)    PLAN:    In order of problems listed above:  Permanent A-fib, denies palpitations.  Continue Toprol-XL 50 mg daily, Xarelto 20 mg daily.  Get echocardiogram. Hypertension, leg edema.  BP controlled.  Stop Norvasc, start losartan 50 mg daily, continue Toprol-XL. Bilateral leg edema, left leg appears more swollen than the right.  Obtain lower extremity ultrasound to  rule out DVT.   History of mild ascending aorta dilatation, obtain echo as above.  Follow-up after echo and leg ultrasound.      Medication Adjustments/Labs and Tests Ordered: Current medicines are reviewed at length with the patient today.  Concerns regarding medicines are outlined above.  Orders Placed This Encounter  Procedures   EKG 12-Lead   ECHOCARDIOGRAM COMPLETE   VAS [place] LOWER EXTREMITY VENOUS (DVT)   Meds ordered this encounter  Medications   losartan (COZAAR) 50 MG tablet    Sig: Take 1 tablet (50 mg total) by mouth daily.    Dispense:  90 tablet    Refill:  3   furosemide (LASIX) 20 MG tablet    Sig: Take 1 tablet (20 mg total) by mouth daily as needed.    Dispense:  90 tablet    Refill:  3    Patient Instructions  Medication Instructions:   STOP Amlodipine  START Losartan - take one tablet (50 mg) by mouth daily.   *If you need a refill on your cardiac medications before your next appointment, please call your pharmacy*   Lab Work:  None Ordered  If you have labs (blood work) drawn today and your tests are completely normal, you will receive your results only by: [place] (if you have MyChart) OR A paper copy in the mail If you have any lab test that is abnormal or we need to change your treatment, we will call you to review the results.   Testing/Procedures:  Your physician has requested that you have an echocardiogram. Echocardiography is a painless test that uses sound waves to create images of your heart. It provides your doctor with information about the size and shape of your heart and how well your heart's chambers and valves are working. This procedure takes approximately one hour. There are no restrictions for this procedure. Please do NOT wear cologne, perfume, aftershave, or lotions (deodorant is allowed). Please arrive 15 minutes prior to your appointment time.  2. Your physician has requested that you have a lower extremity venous  duplex. This test is an ultrasound of the veins in the legs or arms. It looks at venous blood flow that carries blood from the heart to the legs or arms. Allow one hour for a Lower Venous exam. Allow thirty minutes for an Upper Venous exam. There are no restrictions or special instructions.  Follow-Up: At [facility], you and your health needs are our priority.  As part of our continuing mission to provide you with exceptional heart care, we have created designated Provider Care Teams.  These Care Teams include your primary Cardiologist (physician) and Advanced Practice Providers (APPs -  Physician Assistants and Nurse Practitioners) who all work together to provide you with the care you need, when you need it.  We recommend signing up for the patient portal called "MyChart".  Sign up information is provided on this After Visit Summary.  MyChart is used to connect with patients for Virtual Visits (Telemedicine).  Patients are able to view lab/test results, encounter notes, upcoming appointments, etc.  Non-urgent messages can be sent to your provider as well.   To learn more about what you can do with MyChart, go to [email or web address].    Your next appointment:   8 week(s)  Provider:   You may see Kate Sable, MD or one of the following Advanced Practice Providers on your designated Care Team:   Murray Hodgkins, NP Christell Faith, PA-C Cadence Kathlen Mody, PA-C Gerrie Nordmann, NP    Signed, Kate Sable, MD  05/04/2022 10:50 AM    [place]

## 2022-05-11 ENCOUNTER — Telehealth: Payer: Self-pay | Admitting: Cardiology

## 2022-05-11 NOTE — Telephone Encounter (Signed)
Spoke with patients wife. She reports that patient was started on losartan but his blood pressures were elevated. Inquired if these readings were before or after medications. She states it was taken at the same time he took his medication. We discussed importance of checking blood pressure readings 2 hours after taking medication.   She reports that on 3/13 patient switched back to taking Amlodpine 10 mg once daily and stopped the losartan. No other BP readings other that those mentioned in previous note. Advised that I would forward this over to his provider and would give them a call back with any recommendations. She verbalized understanding with no further questions at this time.

## 2022-05-11 NOTE — Telephone Encounter (Signed)
Pt c/o medication issue:  1. Name of Medication: losartan (COZAAR) 50 MG tablet   2. How are you currently taking this medication (dosage and times per day)? Take 1 tablet (50 mg total) by mouth daily.   3. Are you having a reaction (difficulty breathing--STAT)? No  4. What is your medication issue? Pt's wife stated that the pt started taking Losartan on 03/10 and ever since taking the Losartan his BP has been high. Pt's wife stated that the pt switched back to his Amlodipine instead of taking the losartan on the 03/13 due to his BP getting high.  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

## 2022-05-14 MED ORDER — AMLODIPINE BESYLATE 10 MG PO TABS: 10.0000 mg | ORAL_TABLET | Freq: Every day | ORAL | 3 refills | Status: DC

## 2022-05-14 NOTE — Telephone Encounter (Signed)
Spoke with patients wife per release form. Discussed provider recommendations and she was agreeable with plan.       Kate Sable, MD  Sent: Mon May 14, 2022  8:06 AM   Message  Continue amlodipine for now.  Obtain ultrasound as scheduled.  Keep follow-up appointment.

## 2022-05-25 ENCOUNTER — Telehealth: Payer: Self-pay | Admitting: Cardiology

## 2022-05-25 MED ORDER — XARELTO 20 MG PO TABS: 20.0000 mg | ORAL_TABLET | Freq: Every day | ORAL | 0 refills | Status: DC

## 2022-05-25 NOTE — Telephone Encounter (Signed)
*  STAT* If patient is at the pharmacy, call can be transferred to refill team.   1. Which medications need to be refilled? (please list name of each medication and dose if known) XARELTO 20 MG TABS tablet   2. Which pharmacy/location (including street and city if local pharmacy) is medication to be sent to? [id number] - [place], [place] - [address]   3. Do they need a 30 day or 90 day supply? 90 day

## 2022-05-25 NOTE — Telephone Encounter (Signed)
Prescription refill request for Xarelto received.  Indication: afib  Last office visit: Agbor-Etang, 05/04/2022 Weight: 175 kg  Age: 67 yo  Scr: 0.96, 05/02/2022 CrCl: 190 ml/min   Refill sent.

## 2022-06-02 ENCOUNTER — Other Ambulatory Visit: Payer: Self-pay | Admitting: Nurse Practitioner

## 2022-06-02 DIAGNOSIS — M1A0790 Idiopathic chronic gout, unspecified ankle and foot, without tophus (tophi): Secondary | ICD-10-CM

## 2022-06-18 ENCOUNTER — Ambulatory Visit (INDEPENDENT_AMBULATORY_CARE_PROVIDER_SITE_OTHER): Payer: Medicare HMO

## 2022-06-18 ENCOUNTER — Ambulatory Visit: Payer: Medicare HMO | Attending: Cardiology

## 2022-06-18 DIAGNOSIS — I4821 Permanent atrial fibrillation: Secondary | ICD-10-CM | POA: Diagnosis not present

## 2022-06-18 DIAGNOSIS — R600 Localized edema: Secondary | ICD-10-CM | POA: Diagnosis not present

## 2022-06-18 DIAGNOSIS — Z0100 Encounter for examination of eyes and vision without abnormal findings: Secondary | ICD-10-CM | POA: Diagnosis not present

## 2022-06-18 DIAGNOSIS — H2513 Age-related nuclear cataract, bilateral: Secondary | ICD-10-CM | POA: Diagnosis not present

## 2022-06-18 LAB — ECHOCARDIOGRAM COMPLETE
AR max vel: 3.46 cm2
AV Area VTI: 3.31 cm2
AV Area mean vel: 3.31 cm2
AV Mean grad: 3 mmHg
AV Peak grad: 5.5 mmHg
Ao pk vel: 1.17 m/s
Area-P 1/2: 4.77 cm2
Calc EF: 48.4 %
S' Lateral: 3.1 cm
Single Plane A2C EF: 46 %
Single Plane A4C EF: 51.2 %

## 2022-06-29 ENCOUNTER — Ambulatory Visit: Payer: Medicare HMO | Attending: Cardiology | Admitting: Cardiology

## 2022-06-29 ENCOUNTER — Encounter: Payer: Self-pay | Admitting: Cardiology

## 2022-06-29 VITALS — BP 120/60 | HR 86 | Ht 76.0 in | Wt 387.0 lb

## 2022-06-29 DIAGNOSIS — I10 Essential (primary) hypertension: Secondary | ICD-10-CM

## 2022-06-29 DIAGNOSIS — I77810 Thoracic aortic ectasia: Secondary | ICD-10-CM | POA: Diagnosis not present

## 2022-06-29 DIAGNOSIS — I4821 Permanent atrial fibrillation: Secondary | ICD-10-CM

## 2022-06-29 DIAGNOSIS — R600 Localized edema: Secondary | ICD-10-CM

## 2022-06-29 NOTE — Patient Instructions (Signed)
Medication Instructions:  Your physician recommends that you continue on your current medications as directed. Please refer to the Current Medication list given to you today.  *If you need a refill on your cardiac medications before your next appointment, please call your pharmacy*   Lab Work: - None ordered If you have labs (blood work) drawn today and your tests are completely normal, you will receive your results only by: MyChart Message (if you have MyChart) OR A paper copy in the mail If you have any lab test that is abnormal or we need to change your treatment, we will call you to review the results.   Testing/Procedures: - None ordered   Follow-Up: At [facility], you and your health needs are our priority.  As part of our continuing mission to provide you with exceptional heart care, we have created designated Provider Care Teams.  These Care Teams include your primary Cardiologist (physician) and Advanced Practice Providers (APPs -  Physician Assistants and Nurse Practitioners) who all work together to provide you with the care you need, when you need it.  We recommend signing up for the patient portal called "MyChart".  Sign up information is provided on this After Visit Summary.  MyChart is used to connect with patients for Virtual Visits (Telemedicine).  Patients are able to view lab/test results, encounter notes, upcoming appointments, etc.  Non-urgent messages can be sent to your provider as well.   To learn more about what you can do with MyChart, go to [email or web address].    Your next appointment:   6 month(s)  Provider:   Brian Agbor-Etang, MD    Other Instructions - None  

## 2022-06-29 NOTE — Progress Notes (Signed)
Cardiology Office Note:    Date:  06/29/2022   ID:  Tony Hampton, DOB 1956-02-07, MRN [id number]  PCP:  Eden Emms, NP   [facility] Providers Cardiologist:  Debbe Odea, MD     Referring MD: Eden Emms, NP   Chief Complaint  Patient presents with   Follow-up    8 week follow up post testing. Meds reviewed verbally with patient.     History of Present Illness:    Tony Hampton is a 67 y.o. male with a hx of hypertension, permanent atrial fibrillation, OSA, morbid obesity who presents for follow-up.  Previously seen due to leg edema, atrial fibrillation.  Amlodipine was switched to losartan but patient states BP was consistently elevated.  Blood pressures have been better controlled with patient taking amlodipine.  Lower extremity ultrasound was obtained, no evidence for DVT.  He is compliant with Xarelto as prescribed, also takes Toprol-XL.  Denies dizziness, chest pain, palpitations.  Echo was obtained, presents for results.   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 Ernest Pine   TONSILLECTOMY      Current Medications: Current Meds  Medication Sig   allopurinol (ZYLOPRIM) 100 MG tablet TAKE 1 TABLET BY MOUTH TWICE A DAY   amLODipine (NORVASC) 10 MG tablet Take 1 tablet (10 mg total) by mouth daily.   colchicine 0.6 MG tablet TAKE TWO TABLETS BY MOUTH AT ONSET OF GOUT FOLLOWED BY 1 TABLET IN 2 HOURS AS NEEDED.   metoprolol succinate (TOPROL-XL) 50 MG 24 hr tablet TAKE 1 AND  1/2 TABLETS BY MOUTH EVERY DAY   XARELTO 20 MG TABS tablet Take 1 tablet (20 mg total) by mouth daily.     Allergies:   Patient has no known allergies.   Social History   Socioeconomic History   Marital status: Married    Spouse name: Not on file   Number of children: Not on file   Years of education: Not on file   Highest education level: Not on file  Occupational History   Not on file  Tobacco Use   Smoking status: Former    Packs/day: 1.00    Years: 25.00    Additional pack years: 0.00    Total pack years: 25.00    Types: Cigarettes    Quit date: 2008    Years since quitting: 16.3   Smokeless tobacco: Never  Vaping Use   Vaping Use: Never used  Substance and Sexual Activity   Alcohol use: Yes    Comment: OCCASIONALLY. Beer once everyother week   Drug use: No   Sexual activity: Not on file  Other Topics Concern   Not on file  Social History Narrative   Not on file   Social Determinants of Health   Financial Resource Strain: Not on file  Food Insecurity: Not on file  Transportation Needs: Not on file  Physical Activity: Not on file  Stress: Not on file  Social Connections:  Not on file     Family History: The patient's family history includes Asthma in his mother; Breast cancer in his maternal grandmother; Breast cancer (age of onset: 77) in his sister; Diabetes in his father; Gallstones in his father; Hypertension in his father and mother; Other in his mother.  ROS:   Please see the history of present illness.     All other systems reviewed and are negative.  EKGs/Labs/Other Studies Reviewed:    The following studies were reviewed today:   EKG:  EKG not ordered today.   Recent Labs: 12/13/2021: TSH 1.26 05/02/2022: ALT 12; BUN 20; Creatinine, Ser 0.96; Hemoglobin 14.7; Platelets 187.0; Potassium 3.9; Pro B Natriuretic peptide (BNP) 389.0; Sodium 139  Recent Lipid Panel    Component Value Date/Time   CHOL 137 12/13/2021 1026   TRIG 93.0 12/13/2021  1026   HDL 41.30 12/13/2021 1026   CHOLHDL 3 12/13/2021 1026   VLDL 18.6 12/13/2021 1026   LDLCALC 77 12/13/2021 1026   LDLCALC 71 06/13/2021 0931     Risk Assessment/Calculations:             Physical Exam:    VS:  BP 120/60 (BP Location: Left Arm, Patient Position: Sitting, Cuff Size: Large)   Pulse 86   Ht 6\' 4"  (1.93 m)   Wt (!) 387 lb (175.5 kg)   SpO2 95%   BMI 47.11 kg/m     Wt Readings from Last 3 Encounters:  06/29/22 (!) 387 lb (175.5 kg)  05/04/22 (!) 385 lb 12.8 oz (175 kg)  05/02/22 (!) 390 lb (176.9 kg)     GEN:  Well nourished, well developed in no acute distress HEENT: Normal NECK: No JVD; No carotid bruits CARDIAC: Irregular irregular RESPIRATORY:  Clear to auscultation without rales, wheezing or rhonchi  ABDOMEN: Soft, non-tender, non-distended MUSCULOSKELETAL:  1+ edema; No deformity  SKIN: Warm and dry NEUROLOGIC:  Alert and oriented x 3 PSYCHIATRIC:  Normal affect   ASSESSMENT:    1. Permanent atrial fibrillation (HCC)   2. Primary hypertension   3. Bilateral leg edema   4. Ascending aorta dilatation (HCC)     PLAN:    In order of problems listed above:  Permanent A-fib, denies palpitations.  Continue Toprol-XL 75 mg daily, Xarelto 20 mg daily.  Echo shows normal EF 55 to 60%, severely dilated atria. Hypertension, BP controlled.  Continue amlodipine 10 mg daily, continue Toprol-XL 75 mg daily. Bilateral leg edema, no evidence of DVT on ultrasound.  Advised to take Lasix 20 mg daily or as needed if no edema.  Edema likely from morbid obesity. History of mild ascending aorta dilatation, no significant change from prior on recent echo 05/2022.  Continue to monitor serially.  Follow-up in 6 months.      Medication Adjustments/Labs and Tests Ordered: Current medicines are reviewed at length with the patient today.  Concerns regarding medicines are outlined above.  Orders Placed This Encounter  Procedures   EKG 12-Lead   No orders of  the defined types were placed in this encounter.   Patient Instructions  Medication Instructions:  Your physician recommends that you continue on your current medications as directed. Please refer to the Current Medication list given to you today.  *If you need a refill on your cardiac medications before your next appointment, please call your pharmacy*   Lab Work: -None ordered If you have labs (blood work) drawn today and your tests are completely normal, you will receive your results only  by: MyChart Message (if you have MyChart) OR A paper copy in the mail If you have any lab test that is abnormal or we need to change your treatment, we will call you to review the results.   Testing/Procedures: -None ordered   Follow-Up: At [facility], you and your health needs are our priority.  As part of our continuing mission to provide you with exceptional heart care, we have created designated Provider Care Teams.  These Care Teams include your primary Cardiologist (physician) and Advanced Practice Providers (APPs -  Physician Assistants and Nurse Practitioners) who all work together to provide you with the care you need, when you need it.  We recommend signing up for the patient portal called "MyChart".  Sign up information is provided on this After Visit Summary.  MyChart is used to connect with patients for Virtual Visits (Telemedicine).  Patients are able to view lab/test results, encounter notes, upcoming appointments, etc.  Non-urgent messages can be sent to your provider as well.   To learn more about what you can do with MyChart, go to [email or web address].    Your next appointment:   6 month(s)  Provider:   Debbe Odea, MD    Other Instructions -None    Signed, Debbe Odea, MD  06/29/2022 10:26 AM    [facility]

## 2022-07-30 DIAGNOSIS — Z0100 Encounter for examination of eyes and vision without abnormal findings: Secondary | ICD-10-CM | POA: Diagnosis not present

## 2022-07-30 DIAGNOSIS — H2513 Age-related nuclear cataract, bilateral: Secondary | ICD-10-CM | POA: Diagnosis not present

## 2022-08-05 ENCOUNTER — Other Ambulatory Visit: Payer: Self-pay | Admitting: Nurse Practitioner

## 2022-08-05 DIAGNOSIS — R351 Nocturia: Secondary | ICD-10-CM

## 2022-08-17 ENCOUNTER — Telehealth: Payer: Self-pay | Admitting: Nurse Practitioner

## 2022-08-17 MED ORDER — COLCHICINE 0.6 MG PO TABS: ORAL_TABLET | ORAL | 1 refills | Status: DC

## 2022-08-17 NOTE — Addendum Note (Signed)
Addended by: Eden Emms on: 08/17/2022 02:17 PM   Modules accepted: Orders

## 2022-08-17 NOTE — Telephone Encounter (Signed)
Prescription Request  08/17/2022  LOV: 03/01/2022  What is the name of the medication or equipment? colchicine 0.6 MG tablet   Have you contacted your pharmacy to request a refill? Yes   Which pharmacy would you like this sent to?  [facility] [address], [place] - [address] [place] [address] Phone: [phone] Fax: [phone]    Patient notified that their request is being sent to the clinical staff for review and that they should receive a response within 2 business days.   Please advise at [facility] [phone]

## 2022-08-17 NOTE — Telephone Encounter (Signed)
Refill provided

## 2022-08-28 ENCOUNTER — Telehealth: Payer: Self-pay | Admitting: Cardiology

## 2022-08-28 DIAGNOSIS — I4891 Unspecified atrial fibrillation: Secondary | ICD-10-CM

## 2022-08-28 MED ORDER — XARELTO 20 MG PO TABS
20.0000 mg | ORAL_TABLET | Freq: Every day | ORAL | 1 refills | Status: DC
Start: 2022-08-28 — End: 2022-12-05

## 2022-08-28 NOTE — Telephone Encounter (Signed)
*  STAT* If patient is at the pharmacy, call can be transferred to refill team.   1. Which medications need to be refilled? (please list name of each medication and dose if known) Xarelto  2. Which pharmacy/location (including street and city if local pharmacy) is medication to be sent to? [facility] [place] [place]  3. Do they need a 30 day or 90 day supply? 90 days and refills

## 2022-08-28 NOTE — Telephone Encounter (Signed)
Xarelto 20mg  refill request received. Pt is 67 years old, weight-175.5kg, Crea-0.96 on 05/02/22, last seen by Dr. Azucena Cecil on 06/29/22, Diagnosis-Afib, CrCl-190.43 mL/min; Dose is appropriate based on dosing criteria. Will send in refill to requested pharmacy.

## 2022-09-10 ENCOUNTER — Other Ambulatory Visit: Payer: Self-pay | Admitting: Nurse Practitioner

## 2022-09-10 NOTE — Telephone Encounter (Signed)
Pt requests to change medication to a 90 day prescription.   LAST APPOINTMENT DATE: 05/02/2022   NEXT APPOINTMENT DATE: Visit date not found  COLCHICINE   LAST REFILL: 08/17/22  QTY: #20 1RF

## 2022-09-26 ENCOUNTER — Telehealth: Payer: Self-pay | Admitting: Cardiology

## 2022-09-26 NOTE — Telephone Encounter (Signed)
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 Please advise

## 2022-09-26 NOTE — Telephone Encounter (Signed)
Called patient.  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   Requesting a letter from [place] stating he is okay to drive.   Last OV - 06/29/2022  Last Echo - 06/19/2022: Echo shows normal systolic function, continue medications as prescribed.   Last Stress Test: 12/04/2019 (from [place])

## 2022-09-27 NOTE — Telephone Encounter (Signed)
Called patient.  No answer.  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

## 2022-09-28 ENCOUNTER — Encounter: Payer: Self-pay | Admitting: Nurse Practitioner

## 2022-09-28 ENCOUNTER — Other Ambulatory Visit: Payer: Self-pay | Admitting: Nurse Practitioner

## 2022-09-28 ENCOUNTER — Ambulatory Visit: Payer: Medicare HMO | Admitting: Nurse Practitioner

## 2022-09-28 VITALS — BP 110/70 | HR 66 | Temp 98.4°F | Ht 76.0 in | Wt 392.6 lb

## 2022-09-28 DIAGNOSIS — I700 Atherosclerosis of aorta: Secondary | ICD-10-CM | POA: Diagnosis not present

## 2022-09-28 DIAGNOSIS — R600 Localized edema: Secondary | ICD-10-CM

## 2022-09-28 DIAGNOSIS — R0609 Other forms of dyspnea: Secondary | ICD-10-CM

## 2022-09-28 DIAGNOSIS — Z6841 Body Mass Index (BMI) 40.0 and over, adult: Secondary | ICD-10-CM | POA: Diagnosis not present

## 2022-09-28 LAB — COMPREHENSIVE METABOLIC PANEL
ALT: 16 U/L (ref 0–53)
AST: 14 U/L (ref 0–37)
Albumin: 4.4 g/dL (ref 3.5–5.2)
Alkaline Phosphatase: 55 U/L (ref 39–117)
BUN: 18 mg/dL (ref 6–23)
CO2: 28 mEq/L (ref 19–32)
Calcium: 9.7 mg/dL (ref 8.4–10.5)
Chloride: 104 mEq/L (ref 96–112)
Creatinine, Ser: 0.9 mg/dL (ref 0.40–1.50)
GFR: 89.47 mL/min (ref >60.00)
Glucose, Bld: 96 mg/dL (ref 70–99)
Potassium: 4.4 mEq/L (ref 3.5–5.1)
Sodium: 138 mEq/L (ref 135–145)
Total Bilirubin: 1.2 mg/dL (ref 0.2–1.2)
Total Protein: 7.3 g/dL (ref 6.0–8.3)

## 2022-09-28 LAB — CBC
HCT: 46.3 % (ref 39.0–52.0)
Hemoglobin: 15.2 g/dL (ref 13.0–17.0)
MCHC: 32.7 g/dL (ref 30.0–36.0)
MCV: 95.1 fl (ref 78.0–100.0)
Platelets: 200 10*3/uL (ref 150.0–400.0)
RBC: 4.87 Mil/uL (ref 4.22–5.81)
RDW: 14 % (ref 11.5–15.5)
WBC: 7.1 10*3/uL (ref 4.0–10.5)

## 2022-09-28 LAB — BRAIN NATRIURETIC PEPTIDE: Pro B Natriuretic peptide (BNP): 304 pg/mL — ABNORMAL HIGH (ref 0.0–100.0)

## 2022-09-28 MED ORDER — WEGOVY 0.25 MG/0.5ML ~~LOC~~ SOAJ
0.2500 mg | SUBCUTANEOUS | 0 refills | Status: DC
Start: 2022-09-28 — End: 2023-01-15

## 2022-09-28 MED ORDER — ALBUTEROL SULFATE HFA 108 (90 BASE) MCG/ACT IN AERS: 2.0000 | INHALATION_SPRAY | Freq: Four times a day (QID) | RESPIRATORY_TRACT | 0 refills | Status: DC | PRN

## 2022-09-28 NOTE — Patient Instructions (Addendum)
Nice to see you today I have sent in the inhaler. If it helps let me know I will be in touch with the labs once I have them Follow up 3 months for your CPE

## 2022-09-28 NOTE — Assessment & Plan Note (Signed)
Multifactorial.  Patient is on amlodipine and does have a history of elevated BNP patient not been taking fluid pill as of late pending labs may need to take fluid pill consistently for several days

## 2022-09-28 NOTE — Progress Notes (Signed)
Established Patient Office Visit  Subjective   Patient ID: Tony Hampton, male    DOB: 09-Oct-1955  Age: 67 y.o. MRN: [id number]  Chief Complaint  Patient presents with   Hypertension    Pt states Bp was high yesterday. Approx 150/100 then drops. To 130/80.   Shortness of Breath    Started approx 6 months ago. Pt states that changing clothes causes him to become out of breath. Pt wants to know if its due to weight or HTN. No chest pain.      HTN: patient is currently maintained on amlodipine, metoprolol and furosemide. States that he checked it at home and   [facility]: hx of heart failure he is followed by cardiloyg and last echo done was 06/18/2022. EF 55-60%. States shobe with minimal activity like putting his clothes on.  Patient states he recently went to casino with a walking to the casino for back to the room patient had to stop several times to catch his breath.  He is curious if it is related to his heart or his body habitus.  Patient does have an albuterol inhaler at home but has not tried it when he gets short of breath to see if this is beneficial.  Does have a history of smoking along with elevated BNP   Review of Systems  Constitutional:  Negative for chills and fever.  Respiratory:  Positive for shortness of breath.   Cardiovascular:  Positive for leg swelling. Negative for chest pain.  Neurological:  Negative for headaches.      Objective:     BP 110/70 Comment: 120/70 left arm  Pulse 66   Temp 98.4 F (36.9 C) (Temporal)   Ht 6\' 4"  (1.93 m)   Wt (!) 392 lb 9.6 oz (178.1 kg)   SpO2 97%   BMI 47.79 kg/m  BP Readings from Last 3 Encounters:  09/28/22 110/70  06/29/22 120/60  05/04/22 110/70   Wt Readings from Last 3 Encounters:  09/28/22 (!) 392 lb 9.6 oz (178.1 kg)  06/29/22 (!) 387 lb (175.5 kg)  05/04/22 (!) 385 lb 12.8 oz (175 kg)      Physical Exam Vitals and nursing note reviewed.  Constitutional:      Appearance: Normal appearance.   Cardiovascular:     Rate and Rhythm: Normal rate and regular rhythm.     Heart sounds: Normal heart sounds.  Pulmonary:     Effort: Pulmonary effort is normal.     Breath sounds: Normal breath sounds.  Musculoskeletal:     Right lower leg: Edema present.     Left lower leg: Edema present.  Neurological:     Mental Status: He is alert.      No results found for any visits on 09/28/22.    The 10-year ASCVD risk score (Arnett DK, et al., 2019) is: 9.8%    Assessment & Plan:   Problem List Items Addressed This Visit       Cardiovascular and Mediastinum   Aortic atherosclerosis (HCC)    This was noticed on CT scan incidentally.  Patient does have a history of OSA along with morbid obesity will see if insurance will cover under cardiovascular disease      Relevant Medications   Semaglutide-Weight Management (WEGOVY) 0.25 MG/0.5ML SOAJ     Other   Lower extremity edema    Multifactorial.  Patient is on amlodipine and does have a history of elevated BNP patient not been taking fluid pill as of late  pending labs may need to take fluid pill consistently for several days      Relevant Orders   CBC   Comprehensive metabolic panel   Brain natriuretic peptide   DOE (dyspnea on exertion) - Primary    Multifactorial given patient's morbid obesity cardiac history and has no finding of emphysema on the CT scan of the chest.  Patient try albuterol inhaler did renew his albuterol inhaler today to see if this is helpful or patient has shortness of breath.  Did encourage weight loss.  Will also check basic labs inclusive of BNP.      Relevant Orders   CBC   Comprehensive metabolic panel   Brain natriuretic peptide   Other Visit Diagnoses     Morbid obesity (HCC)       Relevant Medications   Semaglutide-Weight Management (WEGOVY) 0.25 MG/0.5ML SOAJ       Return in about 3 months (around 12/29/2022) for CPE and Labs.    Audria Nine, NP

## 2022-09-28 NOTE — Assessment & Plan Note (Signed)
This was noticed on CT scan incidentally.  Patient does have a history of OSA along with morbid obesity will see if insurance will cover under cardiovascular disease

## 2022-09-28 NOTE — Assessment & Plan Note (Signed)
Multifactorial given patient's morbid obesity cardiac history and has no finding of emphysema on the CT scan of the chest.  Patient try albuterol inhaler did renew his albuterol inhaler today to see if this is helpful or patient has shortness of breath.  Did encourage weight loss.  Will also check basic labs inclusive of BNP.

## 2022-10-14 ENCOUNTER — Other Ambulatory Visit: Payer: Self-pay | Admitting: Nurse Practitioner

## 2022-10-24 ENCOUNTER — Encounter: Payer: Self-pay | Admitting: Nurse Practitioner

## 2022-11-02 ENCOUNTER — Other Ambulatory Visit (HOSPITAL_COMMUNITY): Payer: Self-pay

## 2022-11-02 ENCOUNTER — Telehealth: Payer: Self-pay

## 2022-11-02 NOTE — Telephone Encounter (Signed)
Pharmacy Patient Advocate Encounter   Received notification from Patient Advice Request messages that prior authorization for Wegovy 0.25MG /0.5ML auto-injectors is required/requested.   Insurance verification completed.   The patient is insured through [employer] .   Per test claim: PA required; PA submitted to [facility] via CoverMyMeds Key/confirmation #/EOC [id number] Status is pending

## 2022-11-05 ENCOUNTER — Other Ambulatory Visit (HOSPITAL_COMMUNITY): Payer: Self-pay

## 2022-11-05 NOTE — Telephone Encounter (Signed)
Pharmacy Patient Advocate Encounter  Received notification from [facility] that Prior Authorization for Wegovy 0.25MG /0.5ML auto-injectors has been APPROVED from 11/02/22 to 02/26/23. Ran test claim, Copay is $333.69. This test claim was processed through [facility]- copay amounts may vary at other pharmacies due to pharmacy/plan contracts, or as the patient moves through the different stages of their insurance plan.   PA #/Case ID/Reference #:  [id number]

## 2022-11-05 NOTE — Telephone Encounter (Signed)
Per message patient will have large copy sending my chart to let know. Will request call if any questions.

## 2022-11-05 NOTE — Telephone Encounter (Signed)
He can check with the pharmacy and see if that is the monthly copay I imagine that it is for each month

## 2022-11-14 ENCOUNTER — Ambulatory Visit: Payer: Medicare HMO

## 2022-11-28 ENCOUNTER — Ambulatory Visit: Payer: Medicare HMO

## 2022-12-05 ENCOUNTER — Other Ambulatory Visit: Payer: Self-pay | Admitting: Pharmacist

## 2022-12-05 DIAGNOSIS — I4891 Unspecified atrial fibrillation: Secondary | ICD-10-CM

## 2022-12-05 MED ORDER — XARELTO 20 MG PO TABS
20.0000 mg | ORAL_TABLET | Freq: Every day | ORAL | 1 refills | Status: DC
Start: 2022-12-05 — End: 2023-03-27

## 2022-12-26 ENCOUNTER — Ambulatory Visit: Payer: Medicare HMO

## 2023-01-08 ENCOUNTER — Telehealth: Payer: Self-pay

## 2023-01-08 NOTE — Telephone Encounter (Signed)
I spoke with pts wife; pt has had no issues and no N&V or diarrhea. Pt said he feels OK this morning. Sending note to Audria Nine NP.

## 2023-01-08 NOTE — Telephone Encounter (Signed)
noted 

## 2023-01-15 ENCOUNTER — Encounter: Payer: Self-pay | Admitting: Nurse Practitioner

## 2023-01-15 ENCOUNTER — Ambulatory Visit: Payer: Medicare HMO | Admitting: Nurse Practitioner

## 2023-01-15 ENCOUNTER — Ambulatory Visit (INDEPENDENT_AMBULATORY_CARE_PROVIDER_SITE_OTHER): Payer: Medicare HMO

## 2023-01-15 ENCOUNTER — Telehealth: Payer: Self-pay | Admitting: Nurse Practitioner

## 2023-01-15 VITALS — Ht 76.0 in | Wt 386.0 lb

## 2023-01-15 VITALS — BP 112/90 | HR 63 | Temp 97.6°F | Ht 74.0 in | Wt 389.4 lb

## 2023-01-15 DIAGNOSIS — Z0000 Encounter for general adult medical examination without abnormal findings: Secondary | ICD-10-CM

## 2023-01-15 DIAGNOSIS — R7303 Prediabetes: Secondary | ICD-10-CM | POA: Diagnosis not present

## 2023-01-15 DIAGNOSIS — I480 Paroxysmal atrial fibrillation: Secondary | ICD-10-CM | POA: Diagnosis not present

## 2023-01-15 DIAGNOSIS — Z1159 Encounter for screening for other viral diseases: Secondary | ICD-10-CM

## 2023-01-15 DIAGNOSIS — I10 Essential (primary) hypertension: Secondary | ICD-10-CM

## 2023-01-15 DIAGNOSIS — Z125 Encounter for screening for malignant neoplasm of prostate: Secondary | ICD-10-CM | POA: Diagnosis not present

## 2023-01-15 DIAGNOSIS — Z87891 Personal history of nicotine dependence: Secondary | ICD-10-CM | POA: Diagnosis not present

## 2023-01-15 DIAGNOSIS — G4733 Obstructive sleep apnea (adult) (pediatric): Secondary | ICD-10-CM | POA: Diagnosis not present

## 2023-01-15 DIAGNOSIS — I7121 Aneurysm of the ascending aorta, without rupture: Secondary | ICD-10-CM

## 2023-01-15 DIAGNOSIS — Z13220 Encounter for screening for lipoid disorders: Secondary | ICD-10-CM

## 2023-01-15 DIAGNOSIS — Z8739 Personal history of other diseases of the musculoskeletal system and connective tissue: Secondary | ICD-10-CM | POA: Diagnosis not present

## 2023-01-15 DIAGNOSIS — Z1211 Encounter for screening for malignant neoplasm of colon: Secondary | ICD-10-CM

## 2023-01-15 LAB — COMPREHENSIVE METABOLIC PANEL
ALT: 16 U/L (ref 0–53)
AST: 14 U/L (ref 0–37)
Albumin: 4.4 g/dL (ref 3.5–5.2)
Alkaline Phosphatase: 61 U/L (ref 39–117)
BUN: 16 mg/dL (ref 6–23)
CO2: 27 meq/L (ref 19–32)
Calcium: 9.6 mg/dL (ref 8.4–10.5)
Chloride: 103 meq/L (ref 96–112)
Creatinine, Ser: 0.88 mg/dL (ref 0.40–1.50)
GFR: 89.9 mL/min (ref >60.00)
Glucose, Bld: 99 mg/dL (ref 70–99)
Potassium: 4.6 meq/L (ref 3.5–5.1)
Sodium: 139 meq/L (ref 135–145)
Total Bilirubin: 1.5 mg/dL — ABNORMAL HIGH (ref 0.2–1.2)
Total Protein: 6.8 g/dL (ref 6.0–8.3)

## 2023-01-15 LAB — CBC
HCT: 47.8 % (ref 39.0–52.0)
Hemoglobin: 15.8 g/dL (ref 13.0–17.0)
MCHC: 33 g/dL (ref 30.0–36.0)
MCV: 94.6 fL (ref 78.0–100.0)
Platelets: 216 10*3/uL (ref 150.0–400.0)
RBC: 5.05 Mil/uL (ref 4.22–5.81)
RDW: 13.6 % (ref 11.5–15.5)
WBC: 6.9 10*3/uL (ref 4.0–10.5)

## 2023-01-15 LAB — URINALYSIS, MICROSCOPIC ONLY: RBC / HPF: NONE SEEN (ref 0–?)

## 2023-01-15 LAB — URIC ACID: Uric Acid, Serum: 7.9 mg/dL — ABNORMAL HIGH (ref 4.0–7.8)

## 2023-01-15 LAB — TSH: TSH: 1.44 u[IU]/mL (ref 0.35–5.50)

## 2023-01-15 LAB — LIPID PANEL
Cholesterol: 141 mg/dL (ref 0–200)
HDL: 33 mg/dL — ABNORMAL LOW (ref >39.00)
LDL Cholesterol: 86 mg/dL (ref 0–99)
NonHDL: 107.64
Total CHOL/HDL Ratio: 4
Triglycerides: 107 mg/dL (ref 0.0–149.0)
VLDL: 21.4 mg/dL (ref 0.0–40.0)

## 2023-01-15 LAB — PSA, MEDICARE: PSA: 3.3 ng/mL (ref 0.10–4.00)

## 2023-01-15 LAB — HEMOGLOBIN A1C: Hgb A1c MFr Bld: 5.8 % (ref 4.6–6.5)

## 2023-01-15 NOTE — Assessment & Plan Note (Signed)
Patient currently maintained on allopurinol daily and colchicine as needed.  Pending uric acid level today

## 2023-01-15 NOTE — Assessment & Plan Note (Signed)
History of the same patient followed by cardiology currently maintained on Xarelto and metoprolol.  Continue following with cardiology continue medication as prescribed

## 2023-01-15 NOTE — Telephone Encounter (Signed)
Contacted pt to relay message regarding order for CT scan. Pt verbalized and understood the information provided.

## 2023-01-15 NOTE — Assessment & Plan Note (Signed)
Discussed age-appropriate immunizations and screening exams.  Did review patient's personal, surgical, social, family histories.  Patient is up-to-date on all age-appropriate vaccinations he would like.  Patient refused flu and pneumonia vaccine today.  Told patient to get shingles vaccine at local pharmacy.  Patient is overdue for CRC screening patient was given contact information at discharge.  PSA today for prostate cancer screening.  Patient was given information at discharge about preventative healthcare maintenance with anticipatory guidance.

## 2023-01-15 NOTE — Assessment & Plan Note (Signed)
History of the same pending A1c today. ?

## 2023-01-15 NOTE — Assessment & Plan Note (Signed)
History of same.  This should be followed yearly with a CTA.  Patient is overdue order placed today

## 2023-01-15 NOTE — Telephone Encounter (Signed)
Can we call and let patient know that he is due for a CT scan of his chest to monitor the size of the aneurysm.  I have placed the order to be done at the outpatient imaging center off of [address].  They should call him to schedule once approved through insurance

## 2023-01-15 NOTE — Progress Notes (Signed)
Established Patient Office Visit  Subjective   Patient ID: Tony Hampton, male    DOB: [birth year]  Age: 67 y.o. MRN: [id number]  Chief Complaint  Patient presents with   Annual Exam    HPI   Gout: states that it as been a while since he has had a flare. States that it has been several months. States that sandwich [place] did cause a flare.  After that patient decreased intake and has had no trouble.  Patient is on allopurinol 100 mg daily and colchicine as needed  Afib/HTN: followed by caridolohg an on amlodipine, metoprolol, and as neede lasix. He will check bp as needed.  Patient is also on Xarelto for anticoagulation  OSA: CPAP with 10 hours with good sleep   for complete physical and follow up of chronic conditions.  Immunizations: -Tetanus: Completed in 2016 -Influenza: refused -Shingles: Discussed in office get local pharmacy -Pneumonia: refused  -covid: refused   Diet: Fair diet.  He will eat 2-3 meals a day. Sometimes he will snack. He will drink sweet tea. Coffee  Exercise: No regular exercise. Yard work   Eye exam: sees them yearly has readers  Dental exam: Completes semi-annually    Colonoscopy: Completed in 07/31/2021, recall in 1 year patient is overdue. Lung Cancer Screening: Does not qualify  PSA: Due  Sleep: See above about OSA.     Review of Systems  Constitutional:  Negative for chills and fever.  Respiratory:  Positive for shortness of breath.   Cardiovascular:  Negative for chest pain and leg swelling.  Gastrointestinal:  Negative for abdominal pain, blood in stool, constipation, diarrhea, nausea and vomiting.       BM daily   Genitourinary:  Negative for dysuria and hematuria.  Neurological:  Negative for tingling and headaches.  Psychiatric/Behavioral:  Negative for hallucinations and suicidal ideas.       Objective:     BP (!) 112/90   Pulse 63   Temp 97.6 F (36.4 C) (Oral)   Ht 6\' 2"  (1.88 m)   Wt (!) 389 lb 6.4 oz (176.6 kg)    SpO2 92%   BMI 50.00 kg/m  BP Readings from Last 3 Encounters:  01/15/23 (!) 112/90  09/28/22 110/70  06/29/22 120/60   Wt Readings from Last 3 Encounters:  01/15/23 (!) 389 lb 6.4 oz (176.6 kg)  01/15/23 (!) 386 lb (175.1 kg)  09/28/22 (!) 392 lb 9.6 oz (178.1 kg)   SpO2 Readings from Last 3 Encounters:  01/15/23 92%  09/28/22 97%  06/29/22 95%      Physical Exam Vitals and nursing note reviewed.  Constitutional:      Appearance: Normal appearance.  HENT:     Right Ear: Tympanic membrane, ear canal and external ear normal.     Left Ear: Tympanic membrane, ear canal and external ear normal.     Mouth/Throat:     Mouth: Mucous membranes are moist.     Pharynx: Oropharynx is clear.  Eyes:     Extraocular Movements: Extraocular movements intact.     Pupils: Pupils are equal, round, and reactive to light.  Cardiovascular:     Rate and Rhythm: Normal rate and regular rhythm.     Pulses: Normal pulses.     Heart sounds: Normal heart sounds.  Pulmonary:     Effort: Pulmonary effort is normal.     Breath sounds: Normal breath sounds.  Abdominal:     General: Bowel sounds are normal. There is no  distension.     Palpations: There is no mass.     Tenderness: There is no abdominal tenderness.     Hernia: No hernia is present.  Musculoskeletal:     Right lower leg: No edema.     Left lower leg: No edema.  Lymphadenopathy:     Cervical: No cervical adenopathy.  Skin:    General: Skin is warm.  Neurological:     General: No focal deficit present.     Mental Status: He is alert.     Deep Tendon Reflexes:     Reflex Scores:      Bicep reflexes are 2+ on the right side and 2+ on the left side.      Patellar reflexes are 2+ on the right side and 2+ on the left side.    Comments: Bilateral upper and lower extremity strength 5/5  Psychiatric:        Mood and Affect: Mood normal.        Behavior: Behavior normal.        Thought Content: Thought content normal.         Judgment: Judgment normal.      No results found for any visits on 01/15/23.    The 10-year ASCVD risk score (Arnett DK, et al., 2019) is: 11%    Assessment & Plan:   Problem List Items Addressed This Visit       Cardiovascular and Mediastinum   Atrial fibrillation (HCC)    History of the same patient followed by cardiology currently maintained on Xarelto and metoprolol.  Continue following with cardiology continue medication as prescribed      Essential (primary) hypertension    Patient currently maintained on amlodipine, metoprolol and Lasix as needed.  Blood pressure controlled continue medication as prescribed      Relevant Orders   Lipid panel   Aneurysm of ascending aorta without rupture (HCC)    History of same.  This should be followed yearly with a CTA.  Patient is overdue order placed today        Respiratory   OSA on CPAP    Patient seems to be adherent to therapy with good result continue        Other   Morbid obesity (HCC)    Pending TSH, A1c, lipid panel.  Patient has lost some weight continue work on healthy lifestyle modifications.      Relevant Orders   Lipid panel   Preventative health care - Primary    Discussed age-appropriate immunizations and screening exams.  Did review patient's personal, surgical, social, family histories.  Patient is up-to-date on all age-appropriate vaccinations he would like.  Patient refused flu and pneumonia vaccine today.  Told patient to get shingles vaccine at local pharmacy.  Patient is overdue for CRC screening patient was given contact information at discharge.  PSA today for prostate cancer screening.  Patient was given information at discharge about preventative healthcare maintenance with anticipatory guidance.      Relevant Orders   CBC   Comprehensive metabolic panel   TSH   History of gout    Patient currently maintained on allopurinol daily and colchicine as needed.  Pending uric acid level today       Relevant Orders   Uric acid   Prediabetes    History of the same pending A1c today      Relevant Orders   Hemoglobin A1c   Former smoker    Pending urine microscopy to rule  out microscopic hematuria in setting of former tobacco abuse      Relevant Orders   Urine Microscopic   Other Visit Diagnoses     Encounter for hepatitis C screening test for low risk patient       Relevant Orders   Hepatitis C Antibody   Screening for prostate cancer       Relevant Orders   PSA, Medicare   Screening for lipid disorders       Relevant Orders   Lipid panel       Return in about 1 year (around 01/15/2024) for CPE and Labs.    Audria Nine, NP

## 2023-01-15 NOTE — Progress Notes (Signed)
Subjective:   Tony Hampton is a 67 y.o. male who presents for Medicare Annual/Subsequent preventive examination.  Visit Complete: Virtual I connected with  Tony Hampton on 01/15/23 by a audio enabled telemedicine application and verified that I am speaking with the correct person using two identifiers.  Patient Location: Home  Provider Location: Office/Clinic  I discussed the limitations of evaluation and management by telemedicine. The patient expressed understanding and agreed to proceed.  Vital Signs: Because this visit was a virtual/telehealth visit, some criteria may be missing or patient reported. Any vitals not documented were not able to be obtained and vitals that have been documented are patient reported.  Patient Medicare AWV questionnaire was completed by the patient on 01/12/23; I have confirmed that all information answered by patient is correct and no changes since this date. Cardiac Risk Factors include: advanced age (>78men, >71 women);male gender;hypertension;obesity (BMI >30kg/m2);sedentary lifestyle    Objective:    Today's Vitals   01/15/23 0922  Weight: (!) 386 lb (175.1 kg)  Height: 6\' 4"  (1.93 m)   Body mass index is 46.99 kg/m.     01/15/2023    9:31 AM 07/31/2021    6:56 AM  Advanced Directives  Does Patient Have a Medical Advance Directive? No No    Current Medications (verified) Outpatient Encounter Medications as of 01/15/2023  Medication Sig   albuterol (VENTOLIN HFA) 108 (90 Base) MCG/ACT inhaler TAKE 2 PUFFS BY MOUTH EVERY 6 HOURS AS NEEDED FOR WHEEZE OR SHORTNESS OF BREATH   allopurinol (ZYLOPRIM) 100 MG tablet TAKE 1 TABLET BY MOUTH TWICE A DAY   colchicine 0.6 MG tablet TAKE TWO TABLETS BY MOUTH AT ONSET OF GOUT FOLLOWED BY 1 TABLET IN 2 HOURS AS NEEDED.   furosemide (LASIX) 20 MG tablet Take 1 tablet (20 mg total) by mouth daily as needed.   metoprolol succinate (TOPROL-XL) 50 MG 24 hr tablet TAKE 1 AND 1/2 TABLETS BY MOUTH EVERY DAY    tamsulosin (FLOMAX) 0.4 MG CAPS capsule TAKE 1 CAPSULE BY MOUTH EVERY DAY   XARELTO 20 MG TABS tablet Take 1 tablet (20 mg total) by mouth daily.   amLODipine (NORVASC) 10 MG tablet Take 1 tablet (10 mg total) by mouth daily.   Semaglutide-Weight Management (WEGOVY) 0.25 MG/0.5ML SOAJ Inject 0.25 mg into the skin once a week.   No facility-administered encounter medications on file as of 01/15/2023.    Allergies (verified) Patient has no known allergies.   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 Ernest Pine   TONSILLECTOMY     Family History  Problem Relation Age of Onset   Asthma Mother    Hypertension Mother    Other Mother        blood clots in her lungs   Hypertension Father    Diabetes Father    Gallstones Father    Breast cancer Sister 51   Breast cancer Maternal Grandmother    Social History   Socioeconomic History   Marital status: Married  Spouse name: Not on file   Number of children: Not on file   Years of education: Not on file   Highest education level: 12th grade  Occupational History   Not on file  Tobacco Use   Smoking status: Former    Current packs/day: 0.00    Average packs/day: 1 pack/day for 25.0 years (25.0 ttl pk-yrs)    Types: Cigarettes    Start date: 69    Quit date: 2008    Years since quitting: 16.8   Smokeless tobacco: Never  Vaping Use   Vaping status: Never Used  Substance and Sexual Activity   Alcohol use: Yes    Comment: OCCASIONALLY. Beer once everyother week   Drug use: No   Sexual activity:  Not on file  Other Topics Concern   Not on file  Social History Narrative   Not on file   Social Determinants of Health   Financial Resource Strain: Low Risk  (01/12/2023)   Overall Financial Resource Strain (CARDIA)    Difficulty of Paying Living Expenses: Not hard at all  Food Insecurity: No Food Insecurity (01/12/2023)   Hunger Vital Sign    Worried About Running Out of Food in the Last Year: Never true    Ran Out of Food in the Last Year: Never true  Transportation Needs: No Transportation Needs (01/12/2023)   PRAPARE - Administrator, Civil Service (Medical): No    Lack of Transportation (Non-Medical): No  Physical Activity: Inactive (01/12/2023)   Exercise Vital Sign    Days of Exercise per Week: 0 days    Minutes of Exercise per Session: 0 min  Stress: No Stress Concern Present (01/12/2023)   [employer] of Occupational Health - Occupational Stress Questionnaire    Feeling of Stress : Not at all  Social Connections: Moderately Integrated (01/12/2023)   Social Connection and Isolation Panel [NHANES]    Frequency of Communication with Friends and Family: Three times a week    Frequency of Social Gatherings with Friends and Family: Three times a week    Attends Religious Services: Never    Active Member of Clubs or Organizations: Yes    Attends Banker Meetings: 1 to 4 times per year    Marital Status: Married    Tobacco Counseling Counseling given: Not Answered   Clinical Intake:  Pre-visit preparation completed: Yes  Pain : No/denies pain     BMI - recorded: 46.99 Nutritional Status: BMI > 30  Obese Nutritional Risks: None Diabetes: No  How often do you need to have someone help you when you read instructions, pamphlets, or other written materials from your doctor or pharmacy?: 2 - Rarely  Interpreter Needed?: No  Comments: lives with wife Information entered by :: B.Shelina Luo,LPN   Activities of Daily Living     01/12/2023    8:04 AM  In your present state of health, do you have any difficulty performing the following activities:  Hearing? 0  Vision? 0  Difficulty concentrating or making decisions? 0  Walking or climbing stairs? 0  Dressing or bathing? 0  Doing errands, shopping? 0  Preparing Food and eating ? N  Using the Toilet? N  In the past six months, have you accidently leaked urine? N  Do you have problems with loss of bowel control? N  Managing your Medications? N  Managing your Finances? N  Housekeeping or managing your Housekeeping? N    Patient Care Team: Eden Emms, NP as  PCP - General (Pain Medicine) Debbe Odea, MD as PCP - Cardiology (Cardiology)  Indicate any recent Medical Services you may have received from other than [facility] providers in the past year (date may be approximate).     Assessment:   This is a routine wellness examination for Alfard.  Hearing/Vision screen Hearing Screening - Comments:: Pt says his hearing is good Vision Screening - Comments:: Pt says his vision is good;readers only  [place]   Goals Addressed             This Visit's Progress    Patient Stated       I would like to lose 25lb:cut back portions, increase water intake.        Depression Screen    01/15/2023    9:28 AM 09/28/2022   10:48 AM 06/13/2021    9:39 AM 07/24/2019    9:36 AM 12/03/2017    8:18 AM 05/18/2016    6:51 PM 05/18/2016    6:50 PM  PHQ 2/9 Scores  PHQ - 2 Score 0 0 0 0 0 0 0  PHQ- 9 Score  0  0       Fall Risk    01/12/2023    8:04 AM 09/28/2022   10:49 AM 05/02/2022   12:11 PM 12/03/2017    8:18 AM  Fall Risk   Falls in the past year? 0 0 0 No  Number falls in past yr: 0 0 0   Injury with Fall? 0 0 0   Risk for fall due to : No Fall Risks No Fall Risks No Fall Risks   Follow up Education provided;Falls prevention discussed Falls evaluation completed Falls evaluation completed     MEDICARE RISK AT HOME: Medicare Risk at Home Any stairs in  or around the home?: Yes If so, are there any without handrails?: No Home free of loose throw rugs in walkways, pet beds, electrical cords, etc?: Yes Adequate lighting in your home to reduce risk of falls?: Yes Life alert?: No Use of a cane, walker or w/c?: No Grab bars in the bathroom?: No Shower chair or bench in shower?: No Elevated toilet seat or a handicapped toilet?: Yes  TIMED UP AND GO:  Was the test performed?  No    Cognitive Function:        01/15/2023    9:33 AM  6CIT Screen  What Year? 0 points  What month? 0 points  What time? 0 points  Count back from 20 0 points  Months in reverse 0 points  Repeat phrase 0 points  Total Score 0 points    Immunizations Immunization History  Administered Date(s) Administered   Tdap 12/21/2014    TDAP status: Up to date  Flu Vaccine status: Declined, Education has been provided regarding the importance of this vaccine but patient still declined. Advised may receive this vaccine at local pharmacy or Health Dept. Aware to provide a copy of the vaccination record if obtained from local pharmacy or Health Dept. Verbalized acceptance and understanding.  Pneumococcal vaccine status: Declined,  Education has been provided regarding the importance of this vaccine but patient still declined. Advised may receive this vaccine at local pharmacy or Health Dept. Aware to provide a copy of the vaccination record if obtained from local pharmacy or Health Dept. Verbalized acceptance and understanding.   Covid-19 vaccine status: Declined, Education has been provided regarding the importance of this vaccine but patient still declined. Advised may receive this vaccine at local pharmacy  or Health Dept.or vaccine clinic. Aware to provide a copy of the vaccination record if obtained from local pharmacy or Health Dept. Verbalized acceptance and understanding.  Qualifies for Shingles Vaccine? Yes   Zostavax completed No   Shingrix Completed?: No.     Education has been provided regarding the importance of this vaccine. Patient has been advised to call insurance company to determine out of pocket expense if they have not yet received this vaccine. Advised may also receive vaccine at local pharmacy or Health Dept. Verbalized acceptance and understanding.  Screening Tests Health Maintenance  Topic Date Due   Hepatitis C Screening  Never done   Zoster Vaccines- Shingrix (1 of 2) 04/17/2023 (Originally 12/28/2006)   INFLUENZA VACCINE  05/27/2023 (Originally 09/27/2022)   Pneumonia Vaccine 13+ Years old (1 of 1 - PCV) 01/15/2024 (Originally 12/27/2021)   Colonoscopy  01/15/2024 (Originally 08/01/2022)   Medicare Annual Wellness (AWV)  01/15/2024   DTaP/Tdap/Td (2 - Td or Tdap) 12/20/2024   HPV VACCINES  Aged Out   COVID-19 Vaccine  Discontinued    Health Maintenance  Health Maintenance Due  Topic Date Due   Hepatitis C Screening  Never done    Colorectal cancer screening: Last 07/31/2021   Lung Cancer Screening: (Low Dose CT Chest recommended if Age 80-80 years, 20 pack-year currently smoking OR have quit w/in 15years.) does not qualify.   Lung Cancer Screening Referral: no  Additional Screening:  Hepatitis C Screening: does qualify; Completed no  Vision Screening: Recommended annual ophthalmology exams for early detection of glaucoma and other disorders of the eye. Is the patient up to date with their annual eye exam?  Yes  Who is the provider or what is the name of the office in which the patient attends annual eye exams? Dr Dingeldein-appt in [place] If pt is not established with a provider, would they like to be referred to a provider to establish care? Yes .   Dental Screening: Recommended annual dental exams for proper oral hygiene  Diabetic Foot Exam: n/a  Community Resource Referral / Chronic Care Management: CRR required this visit?  No   CCM required this visit?  No    Plan:     I have personally reviewed and noted  the following in the patient's chart:   Medical and social history Use of alcohol, tobacco or illicit drugs  Current medications and supplements including opioid prescriptions. Patient is not currently taking opioid prescriptions. Functional ability and status Nutritional status Physical activity Advanced directives List of other physicians Hospitalizations, surgeries, and ER visits in previous 12 months Vitals Screenings to include cognitive, depression, and falls Referrals and appointments  In addition, I have reviewed and discussed with patient certain preventive protocols, quality metrics, and best practice recommendations. A written personalized care plan for preventive services as well as general preventive health recommendations were provided to patient.    Sue Lush, LPN   52/84/[id number]   After Visit Summary: (MyChart) Due to this being a telephonic visit, the after visit summary with patients personalized plan was offered to patient via MyChart   Nurse Notes: The patient states he is doing well and has no concerns or questions at this time.

## 2023-01-15 NOTE — Patient Instructions (Addendum)
Nice to see you today I will be in touch with the labs once I have reviewed them Follow up with me in 1 year, sooner if you need me  Call Dr. Verdis Prime office for a repeat colonoscopy  Address: [address], [place], [place] [address] Phone: [phone]  Consider getting the Shingrix vaccine

## 2023-01-15 NOTE — Patient Instructions (Addendum)
Mr. Tony Hampton , Thank you for taking time to come for your Medicare Wellness Visit. I appreciate your ongoing commitment to your health goals. Please review the following plan we discussed and let me know if I can assist you in the future.   Referrals/Orders/Follow-Ups/Clinician Recommendations:   A referral for colonoscopy was placed. Please call for appt if you have not heard from them in 7 days.: [facility] [place], [place]   Phone: [phone]   Advance Care Planning is important because it:  [x]  Makes sure you receive the medical care that is consistent with your values, goals, and preferences  [x]  It provides guidance to your family and loved ones and it also reduces their decisional burden about whether or not they are making the right decisions based on what you want done  Follow the link provided in your after visit summary or read over the paperwork we have mailed to you to help you started getting your Advance Directives in place. If you need assistance in completing these, please reach out to [place] so that we can help you!   Vaccinations: declines all Influenza vaccine: recommend every Fall Pneumococcal vaccine: recommend once per lifetime Prevnar-20 Shingles vaccine: recommend Shingrix which is 2 doses 2-6 months apart and over 90% effective     Covid-19: recommend 2 doses one month apart with a booster 6 months later    This is a list of the screening recommended for you and due dates:  Health Maintenance  Topic Date Due   Hepatitis C Screening  Never done   Zoster (Shingles) Vaccine (1 of 2) Never done   Pneumonia Vaccine (1 of 1 - PCV) Never done   Colon Cancer Screening  08/01/2022   Flu Shot  Never done   Medicare Annual Wellness Visit  01/15/2024   DTaP/Tdap/Td vaccine (2 - Td or Tdap) 12/20/2024   HPV Vaccine  Aged Out   COVID-19 Vaccine  Discontinued    Advanced directives: (Declined) Advance directive discussed with you today.  Even though you declined this today, please call our office should you change your mind, and we can give you the proper paperwork for you to fill out.  Next Medicare Annual Wellness Visit scheduled for next year: Yes 01/16/24 @ 9:30am telephone

## 2023-01-15 NOTE — Assessment & Plan Note (Signed)
Patient currently maintained on amlodipine, metoprolol and Lasix as needed.  Blood pressure controlled continue medication as prescribed

## 2023-01-15 NOTE — Assessment & Plan Note (Signed)
Patient seems to be adherent to therapy with good result continue

## 2023-01-15 NOTE — Assessment & Plan Note (Signed)
Pending TSH, A1c, lipid panel.  Patient has lost some weight continue work on healthy lifestyle modifications.

## 2023-01-15 NOTE — Assessment & Plan Note (Signed)
Pending urine microscopy to rule out microscopic hematuria in setting of former tobacco abuse

## 2023-01-17 ENCOUNTER — Encounter: Payer: Self-pay | Admitting: *Deleted

## 2023-01-17 LAB — HEPATITIS C ANTIBODY: Hepatitis C Ab: NONREACTIVE

## 2023-01-18 ENCOUNTER — Other Ambulatory Visit: Payer: Self-pay | Admitting: Nurse Practitioner

## 2023-01-18 DIAGNOSIS — R972 Elevated prostate specific antigen [PSA]: Secondary | ICD-10-CM

## 2023-01-29 DIAGNOSIS — H2512 Age-related nuclear cataract, left eye: Secondary | ICD-10-CM | POA: Diagnosis not present

## 2023-02-06 DIAGNOSIS — B349 Viral infection, unspecified: Secondary | ICD-10-CM | POA: Diagnosis not present

## 2023-02-06 DIAGNOSIS — J9801 Acute bronchospasm: Secondary | ICD-10-CM | POA: Diagnosis not present

## 2023-02-21 ENCOUNTER — Other Ambulatory Visit: Payer: Self-pay | Admitting: Nurse Practitioner

## 2023-02-21 DIAGNOSIS — R351 Nocturia: Secondary | ICD-10-CM

## 2023-02-28 ENCOUNTER — Other Ambulatory Visit (HOSPITAL_COMMUNITY): Payer: Self-pay

## 2023-02-28 MED ORDER — RIVAROXABAN 20 MG PO TABS
20.0000 mg | ORAL_TABLET | Freq: Every day | ORAL | 1 refills | Status: DC
  Filled 2023-02-28 – 2023-03-01 (×2): qty 90, 90d supply, fill #0

## 2023-02-28 NOTE — Telephone Encounter (Signed)
 Noted. I have sent a message to PCP as FYI that patient put CT on hold until after Cardiology Appt 03/27/23

## 2023-03-01 ENCOUNTER — Other Ambulatory Visit (HOSPITAL_COMMUNITY): Payer: Self-pay

## 2023-03-01 ENCOUNTER — Other Ambulatory Visit: Payer: Self-pay

## 2023-03-01 ENCOUNTER — Other Ambulatory Visit (HOSPITAL_BASED_OUTPATIENT_CLINIC_OR_DEPARTMENT_OTHER): Payer: Self-pay

## 2023-03-06 ENCOUNTER — Other Ambulatory Visit: Payer: Self-pay | Admitting: Nurse Practitioner

## 2023-03-06 DIAGNOSIS — M1A0790 Idiopathic chronic gout, unspecified ankle and foot, without tophus (tophi): Secondary | ICD-10-CM

## 2023-03-11 ENCOUNTER — Other Ambulatory Visit: Payer: Self-pay

## 2023-03-11 MED ORDER — AMLODIPINE BESYLATE 10 MG PO TABS: 10.0000 mg | ORAL_TABLET | Freq: Every day | ORAL | 0 refills | Status: DC

## 2023-03-13 ENCOUNTER — Other Ambulatory Visit: Payer: Self-pay

## 2023-03-13 ENCOUNTER — Other Ambulatory Visit (HOSPITAL_COMMUNITY): Payer: Self-pay

## 2023-03-13 MED ORDER — METOPROLOL SUCCINATE ER 50 MG PO TB24: ORAL_TABLET | ORAL | 0 refills | Status: DC

## 2023-03-27 ENCOUNTER — Ambulatory Visit: Payer: 59 | Attending: Cardiology | Admitting: Cardiology

## 2023-03-27 ENCOUNTER — Encounter: Payer: Self-pay | Admitting: Cardiology

## 2023-03-27 VITALS — BP 122/74 | HR 53 | Ht 75.0 in | Wt 390.4 lb

## 2023-03-27 DIAGNOSIS — I10 Essential (primary) hypertension: Secondary | ICD-10-CM | POA: Diagnosis not present

## 2023-03-27 DIAGNOSIS — I4821 Permanent atrial fibrillation: Secondary | ICD-10-CM | POA: Diagnosis not present

## 2023-03-27 DIAGNOSIS — R0602 Shortness of breath: Secondary | ICD-10-CM

## 2023-03-27 DIAGNOSIS — I77810 Thoracic aortic ectasia: Secondary | ICD-10-CM | POA: Diagnosis not present

## 2023-03-27 NOTE — Progress Notes (Signed)
Cardiology Office Note:    Date:  03/27/2023   ID:  Tony Hampton, DOB 10-22-1955, MRN [id number]  PCP:  Eden Emms, NP   [facility] Providers Cardiologist:  Debbe Odea, MD     Referring MD: Eden Emms, NP   Chief Complaint  Patient presents with   Follow-up    Patient concerns of dyspnea on exertion with evan small tasks like getting dressed.      History of Present Illness:    Tony Hampton is a 68 y.o. 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  Endorses shortness of breath typically when he bends over.  Denies chest pain.  Compliant with medications as prescribed.  Edema is well-controlled with Lasix.  Blood pressure also adequately controlled.  Denies dizziness, presyncope.  Prior notes/testing Echo 05/2022 EF 55 to 60%, mild aortic root dilatation 43 mm.  Ascending aorta 40 mm. 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 Ernest Pine   TONSILLECTOMY      Current Medications: Current Meds  Medication Sig   albuterol (VENTOLIN HFA) 108 (90 Base) MCG/ACT inhaler TAKE 2 PUFFS BY MOUTH EVERY 6 HOURS AS NEEDED FOR WHEEZE OR SHORTNESS OF BREATH     Allergies:   Patient has no known allergies.   Social History   Socioeconomic History   Marital status: Married    Spouse name: Not on file   Number of children: Not on file   Years of education: Not on file    Highest education level: 12th grade  Occupational History   Not on file  Tobacco Use   Smoking status: Former    Current packs/day: 0.00    Average packs/day: 1 pack/day for 25.0 years (25.0 ttl pk-yrs)    Types: Cigarettes    Start date: 35    Quit date: 2008    Years since quitting: 17.0   Smokeless tobacco: Never  Vaping Use   Vaping status: Never Used  Substance and Sexual Activity   Alcohol use: Yes    Comment: OCCASIONALLY. Beer once everyother week   Drug use: No   Sexual activity: Not on file  Other Topics Concern   Not on file  Social History Narrative   Not on file   Social Drivers of Health   Financial Resource Strain: Low Risk  (01/12/2023)   Overall Financial Resource Strain (CARDIA)    Difficulty of Paying Living Expenses: Not hard at all  Food Insecurity: No Food Insecurity (01/12/2023)   Hunger Vital Sign    Worried About Running Out of Food in the Last Year: Never true    Ran Out of Food in the Last Year: Never true  Transportation Needs: No Transportation Needs (01/12/2023)   PRAPARE - Administrator, Civil Service (Medical): No    Lack of Transportation (Non-Medical): No  Physical Activity: Inactive (01/12/2023)  Exercise Vital Sign    Days of Exercise per Week: 0 days    Minutes of Exercise per Session: 0 min  Stress: No Stress Concern Present (01/12/2023)   [employer] of Occupational Health - Occupational Stress Questionnaire    Feeling of Stress : Not at all  Social Connections: Moderately Integrated (01/12/2023)   Social Connection and Isolation Panel [NHANES]    Frequency of Communication with Friends and Family: Three times a week    Frequency of Social Gatherings with Friends and Family: Three times a week    Attends Religious Services: Never    Active Member of Clubs or Organizations: Yes    Attends Banker Meetings: 1 to 4 times per year    Marital Status: Married     Family History: The patient's  family history includes Asthma in his mother; Breast cancer in his maternal grandmother; Breast cancer (age of onset: 20) in his sister; Diabetes in his father; Gallstones in his father; Hypertension in his father and mother; Other in his mother.  ROS:   Please see the history of present illness.     All other systems reviewed and are negative.  EKGs/Labs/Other Studies Reviewed:    The following studies were reviewed today:   EKG Interpretation Date/Time:  Wednesday March 27 2023 10:50:57 EST Ventricular Rate:  53 PR Interval:    QRS Duration:  144 QT Interval:  434 QTC Calculation: 407 R Axis:   110  Text Interpretation: Atrial fibrillation with slow ventricular response Right bundle branch block Left posterior fascicular block Confirmed by Debbe Odea ([id number]) on 03/27/2023 11:20:14 AM    Recent Labs: 09/28/2022: Pro B Natriuretic peptide (BNP) 304.0 01/15/2023: ALT 16; BUN 16; Creatinine, Ser 0.88; Hemoglobin 15.8; Platelets 216.0; Potassium 4.6; Sodium 139; TSH 1.44  Recent Lipid Panel    Component Value Date/Time   CHOL 141 01/15/2023 1113   TRIG 107.0 01/15/2023 1113   HDL 33.00 (L) 01/15/2023 1113   CHOLHDL 4 01/15/2023 1113   VLDL 21.4 01/15/2023 1113   LDLCALC 86 01/15/2023 1113   LDLCALC 71 06/13/2021 0931     Risk Assessment/Calculations:             Physical Exam:    VS:  BP 122/74 (BP Location: Left Arm, Patient Position: Sitting, Cuff Size: Large)   Pulse (!) 53   Ht 6\' 3"  (1.905 m)   Wt (!) 390 lb 6.4 oz (177.1 kg)   SpO2 94%   BMI 48.80 kg/m     Wt Readings from Last 3 Encounters:  03/27/23 (!) 390 lb 6.4 oz (177.1 kg)  01/15/23 (!) 389 lb 6.4 oz (176.6 kg)  01/15/23 (!) 386 lb (175.1 kg)     GEN:  Well nourished, well developed in no acute distress HEENT: Normal NECK: No JVD; No carotid bruits CARDIAC: Irregular irregular RESPIRATORY:  Clear to auscultation without rales, wheezing or rhonchi  ABDOMEN: Soft, non-tender,  distended MUSCULOSKELETAL:  no edema; No deformity  SKIN: Warm and dry NEUROLOGIC:  Alert and oriented x 3 PSYCHIATRIC:  Normal affect   ASSESSMENT:    1. Shortness of breath   2. Permanent atrial fibrillation (HCC)   3. Primary hypertension   4. Ascending aorta dilatation (HCC)   5. Morbid obesity (HCC)    PLAN:    In order of problems listed above:  Shortness of breath, etiology likely combination of morbid obesity, sleep apnea, deconditioning.  Recent echo with normal EF.  Conditioning, low-calorie diet advised.  Denies chest pain. Permanent A-fib, denies palpitations.  Continue Toprol-XL 75 mg daily, Xarelto 20 mg daily.  Echo 05/2022 normal EF 55 to 60%, severely dilated atria. Hypertension, BP controlled.  Continue amlodipine 10 mg daily, continue Toprol-XL 75 mg daily. Mild ascending aorta and aortic root dilatation.  Repeat echo in 4 months / 1 year from prior. Morbid obesity, low-calorie diet, weight loss advised.  Follow-up in 6-12 months.      Medication Adjustments/Labs and Tests Ordered: Current medicines are reviewed at length with the patient today.  Concerns regarding medicines are outlined above.  Orders Placed This Encounter  Procedures   EKG 12-Lead   ECHOCARDIOGRAM COMPLETE   No orders of the defined types were placed in this encounter.   Patient Instructions  Medication Instructions:   Your Physician recommend you continue on your current medication as directed.     *If you need a refill on your cardiac medications before your next appointment, please call your pharmacy*   Lab Work: None ordered. If you have labs (blood work) drawn today and your tests are completely normal, you will receive your results only by: MyChart Message (if you have MyChart) OR A paper copy in the mail If you have any lab test that is abnormal or we need to change your treatment, we will call you to review the results.   Testing/Procedures: Your physician has  requested that you have an echocardiogram in 4 months. Echocardiography is a painless test that uses sound waves to create images of your heart. It provides your doctor with information about the size and shape of your heart and how well your heart's chambers and valves are working.   You may receive an ultrasound enhancing agent through an IV if needed to better visualize your heart during the echo. This procedure takes approximately one hour.  There are no restrictions for this procedure.  This will take place at [address] [facility] [address]) [address], [place] [address]  Please note: We ask at that you not bring children with you during ultrasound (echo/ vascular) testing. Due to room size and safety concerns, children are not allowed in the ultrasound rooms during exams. Our front office staff cannot provide observation of children in our lobby area while testing is being conducted. An adult accompanying a patient to their appointment will only be allowed in the ultrasound room at the discretion of the ultrasound technician under special circumstances. We apologize for any inconvenience.    Follow-Up: At [facility], you and your health needs are our priority.  As part of our continuing mission to provide you with exceptional heart care, we have created designated Provider Care Teams.  These Care Teams include your primary Cardiologist (physician) and Advanced Practice Providers (APPs -  Physician Assistants and Nurse Practitioners) who all work together to provide you with the care you need, when you need it.  We recommend signing up for the patient portal called "MyChart".  Sign up information is provided on this After Visit Summary.  MyChart is used to connect with patients for Virtual Visits (Telemedicine).  Patients are able to view lab/test results, encounter notes, upcoming appointments, etc.  Non-urgent messages can be sent to your provider as well.   To learn more about  what you can do with MyChart, go to [email or web address].    Your next appointment:   1 year(s)  Provider:   You may see Debbe Odea, MD or one of the following Advanced Practice Providers on  your designated Care Team:   Nicolasa Ducking, NP Eula Listen, PA-C Cadence Fransico Michael, PA-C Charlsie Quest, NP Carlos Levering, NP             Signed, Debbe Odea, MD  03/27/2023 12:29 PM    [facility]

## 2023-03-27 NOTE — Patient Instructions (Signed)
Medication Instructions:   Your Physician recommend you continue on your current medication as directed.     *If you need a refill on your cardiac medications before your next appointment, please call your pharmacy*   Lab Work: None ordered. If you have labs (blood work) drawn today and your tests are completely normal, you will receive your results only by: MyChart Message (if you have MyChart) OR A paper copy in the mail If you have any lab test that is abnormal or we need to change your treatment, we will call you to review the results.   Testing/Procedures: Your physician has requested that you have an echocardiogram in 4 months. Echocardiography is a painless test that uses sound waves to create images of your heart. It provides your doctor with information about the size and shape of your heart and how well your heart's chambers and valves are working.   You may receive an ultrasound enhancing agent through an IV if needed to better visualize your heart during the echo. This procedure takes approximately one hour.  There are no restrictions for this procedure.  This will take place at [address] [facility] [address]) [address], [place] [address]  Please note: We ask at that you not bring children with you during ultrasound (echo/ vascular) testing. Due to room size and safety concerns, children are not allowed in the ultrasound rooms during exams. Our front office staff cannot provide observation of children in our lobby area while testing is being conducted. An adult accompanying a patient to their appointment will only be allowed in the ultrasound room at the discretion of the ultrasound technician under special circumstances. We apologize for any inconvenience.    Follow-Up: At [facility], you and your health needs are our priority.  As part of our continuing mission to provide you with exceptional heart care, we have created designated Provider Care Teams.  These  Care Teams include your primary Cardiologist (physician) and Advanced Practice Providers (APPs -  Physician Assistants and Nurse Practitioners) who all work together to provide you with the care you need, when you need it.  We recommend signing up for the patient portal called "MyChart".  Sign up information is provided on this After Visit Summary.  MyChart is used to connect with patients for Virtual Visits (Telemedicine).  Patients are able to view lab/test results, encounter notes, upcoming appointments, etc.  Non-urgent messages can be sent to your provider as well.   To learn more about what you can do with MyChart, go to [email or web address].    Your next appointment:   1 year(s)  Provider:   You may see Debbe Odea, MD or one of the following Advanced Practice Providers on your designated Care Team:   Nicolasa Ducking, NP Eula Listen, PA-C Cadence Fransico Michael, PA-C Charlsie Quest, NP Carlos Levering, NP

## 2023-04-02 ENCOUNTER — Telehealth: Payer: Self-pay

## 2023-04-02 DIAGNOSIS — Z860100 Personal history of colon polyps, unspecified: Secondary | ICD-10-CM

## 2023-04-02 NOTE — Telephone Encounter (Signed)
 Gastroenterology Pre-Procedure Review  Request Date: TBD Requesting Physician: Dr. Unk  PATIENT REVIEW QUESTIONS: The patient responded to the following health history questions as indicated:    1. Are you having any GI issues? no 2. Do you have a personal history of Polyps? yes (last colon was with Dr. ) 3. Do you have a family history of Colon Cancer or Polyps? yes (dad had colon polyps) 4. Diabetes Mellitus? no 5. Joint replacements in the past 12 months?no 6. Major health problems in the past 3 months?no 7. Any artificial heart valves, MVP, or defibrillator?no 8. Cardiac issues? Yes clearance sent to CV Preop Team     MEDICATIONS & ALLERGIES:    Patient reports the following regarding taking any anticoagulation/antiplatelet therapy:   Plavix, Coumadin, Eliquis, Xarelto , Lovenox, Pradaxa, Brilinta, or Effient? yes (Pt takes Xarelto  stop date to be advised by Cardiology) Aspirin? no  Patient confirms/reports the following medications:  Current Outpatient Medications  Medication Sig Dispense Refill   albuterol  (VENTOLIN  HFA) 108 (90 Base) MCG/ACT inhaler TAKE 2 PUFFS BY MOUTH EVERY 6 HOURS AS NEEDED FOR WHEEZE OR SHORTNESS OF BREATH 18 each 0   allopurinol  (ZYLOPRIM ) 100 MG tablet TAKE 1 TABLET BY MOUTH TWICE A DAY (Patient taking differently: Take 100 mg by mouth daily.) 180 tablet 1   amLODipine  (NORVASC ) 10 MG tablet Take 1 tablet (10 mg total) by mouth daily. 90 tablet 0   colchicine  0.6 MG tablet TAKE TWO TABLETS BY MOUTH AT ONSET OF GOUT FOLLOWED BY 1 TABLET IN 2 HOURS AS NEEDED. 20 tablet 1   furosemide  (LASIX ) 20 MG tablet Take 1 tablet (20 mg total) by mouth daily as needed. 90 tablet 3   metoprolol  succinate (TOPROL -XL) 50 MG 24 hr tablet TAKE 1 AND 1/2 TABLETS BY MOUTH EVERY DAY 135 tablet 0   rivaroxaban  (XARELTO ) 20 MG TABS tablet Take 1 tablet (20 mg total) by mouth daily. 90 tablet 1   tamsulosin  (FLOMAX ) 0.4 MG CAPS capsule TAKE 1 CAPSULE BY MOUTH EVERY DAY 90  capsule 1   No current facility-administered medications for this visit.    Patient confirms/reports the following allergies:  No Known Allergies  No orders of the defined types were placed in this encounter.   AUTHORIZATION INFORMATION Primary Insurance: 1D#: Group #:  Secondary Insurance: 1D#: Group #:  SCHEDULE INFORMATION: Date: TBD Time: Location: [facility]

## 2023-04-02 NOTE — Telephone Encounter (Signed)
 The patient wife Orrin) in and left a voicemail requesting to schedule her husband colonoscopy. I called the patient back to let her know we received her message, Dr. Unk I recommend you have a repeat colonoscopy in 1 year to determine if you have developed any new polyps and to screen for colorectal cancer.

## 2023-04-03 ENCOUNTER — Telehealth: Payer: Self-pay | Admitting: *Deleted

## 2023-04-03 NOTE — Telephone Encounter (Signed)
Pharmacy please advise on holding Xarelto prior to colonoscopy scheduled for TBD. Thank you.

## 2023-04-03 NOTE — Telephone Encounter (Signed)
   Pre-operative Risk Assessment    Patient Name: Tony Hampton  DOB: January 20, 1956 MRN: [id number]   Date of last office visit: 03/27/23 DR. AGBOR-ETANG Date of next office visit: NONE   Request for Surgical Clearance    Procedure:  COLONOSCOPY  Date of Surgery:  Clearance TBD                                Surgeon:  DR. VANGA Surgeon's Group or Practice Name:  [facility] Phone number:  [phone] ROSALINE MING, CMA Fax number:  [phone]   Type of Clearance Requested:   - Medical  - Pharmacy:  Hold Rivaroxaban  (Xarelto )     Type of Anesthesia:  General    Additional requests/questions:    [name]   04/03/2023, 10:02 AM

## 2023-04-03 NOTE — Telephone Encounter (Signed)
 Good Morning Dr. Darliss  We have received a surgical clearance request for [name] for colonoscopy procedure. They were seen recently in clinic on 03/27/2023. Can you please comment on surgical clearance for his upcoming colonoscopy. Please forward you guidance and recommendations to P CV DIV PREOP   Thanks, Jackee Alberts, NP

## 2023-04-05 NOTE — Addendum Note (Signed)
 Addended by: Leellen Puller on: 04/05/2023 12:02 PM   Modules accepted: Orders

## 2023-04-05 NOTE — Telephone Encounter (Signed)
     Primary Cardiologist: Redell Cave, MD  Chart reviewed as part of pre-operative protocol coverage. Given past medical history and time since last visit, based on ACC/AHA guidelines, Tony Hampton would be at acceptable risk for the planned procedure without further cardiovascular testing.   Per Dr. Cave-  okay for colonoscopy from a cardiac perspective.  Okay to hold Xarelto  48 hours prior to procedure.  Restart as soon as safely possible after.   I will route this recommendation to the requesting party via Epic fax function and remove from pre-op pool.  Please call with questions.  Tony Hampton. Jeweldean Drohan NP-C     04/05/2023, 8:55 AM [facility] [address] Office [phone] Fax [phone]

## 2023-04-05 NOTE — Telephone Encounter (Signed)
 Left voice message for pt to call office to schedule colonoscopy.    Rosaline, CMA  Cardiac Clearance Granted and Blood Thinner Advice Received. Per Heart Care Note dated 04/05/23:     04/05/23  8:56 AM Note      Primary Cardiologist: Redell Cave, MD   Chart reviewed as part of pre-operative protocol coverage. Given past medical history and time since last visit, based on ACC/AHA guidelines, Tony Hampton would be at acceptable risk for the planned procedure without further cardiovascular testing.    Per Dr. Cave-  okay for colonoscopy from a cardiac perspective.  Okay to hold Xarelto  48 hours prior to procedure.  Restart as soon as safely possible after.    I will route this recommendation to the requesting party via Epic fax function and remove from pre-op pool.   Please call with questions.   Josefa HERO. Cleaver NP-C

## 2023-04-09 NOTE — Telephone Encounter (Signed)
Pt's wife, Kendal Hymen left a vm that she was returning your call from last Friday to schedule Bradshaw's colonoscopy.

## 2023-04-10 ENCOUNTER — Other Ambulatory Visit: Payer: Self-pay

## 2023-04-10 DIAGNOSIS — Z860100 Personal history of colon polyps, unspecified: Secondary | ICD-10-CM

## 2023-04-10 MED ORDER — NA SULFATE-K SULFATE-MG SULF 17.5-3.13-1.6 GM/177ML PO SOLN: 1.0000 | Freq: Once | ORAL | 0 refills | Status: AC

## 2023-04-10 NOTE — Addendum Note (Signed)
Addended by: Avie Arenas on: 04/10/2023 10:18 AM   Modules accepted: Orders

## 2023-04-10 NOTE — Telephone Encounter (Signed)
Patient wife Tony Hampton called and left a voicemail to schedule his colonoscopy

## 2023-04-12 NOTE — Telephone Encounter (Signed)
Contacted patients wife Kendal Hymen to make sure I told her that Calub will need to stop his Xarelto on 05/13/23.  She said yes she has it noted on the calendar.  She wanted to  confirm that we do have his insurance as [employer].  Checked the referral and it is noted [employer].  Marcelino Duster, CMA

## 2023-04-16 NOTE — Telephone Encounter (Signed)
 Patient has declined CT scan at this time. Holding off until later this year.   He is being followed by Cardiology.  I dont know if he is confused on what scan I am talking about, even though its stated in the message that I am talking about the CT Angio Chest, He may need some clarification if you feel like that this is needed now.   Please advise, thanks.

## 2023-04-18 NOTE — Telephone Encounter (Signed)
 Dr. Azucena Cecil  I am trying to get the patient to follow up on his aneurysm but they wanted to speak to you first. I see that they had an appointment with you on 03/27/2023 and you ordered an Echo. If you are in agreement with him proceeding with the CT scan for aneurysm surveillance let me know and I will relay that to the patient and hopefully get him scheduled.  Thanks,  [employer]

## 2023-04-22 NOTE — Telephone Encounter (Signed)
Noted. Order canceled

## 2023-04-22 NOTE — Telephone Encounter (Signed)
 Ok to cancel the CT angio of the chest that was ordered by me

## 2023-05-06 ENCOUNTER — Encounter: Payer: Self-pay | Admitting: Nurse Practitioner

## 2023-05-06 ENCOUNTER — Other Ambulatory Visit: Payer: Self-pay | Admitting: Nurse Practitioner

## 2023-05-06 ENCOUNTER — Ambulatory Visit (INDEPENDENT_AMBULATORY_CARE_PROVIDER_SITE_OTHER)
Admission: RE | Admit: 2023-05-06 | Discharge: 2023-05-06 | Disposition: A | Discharge status: Home / Self Care | Source: Ambulatory Visit | Attending: Nurse Practitioner

## 2023-05-06 ENCOUNTER — Ambulatory Visit: Payer: Self-pay | Admitting: Nurse Practitioner

## 2023-05-06 ENCOUNTER — Ambulatory Visit (INDEPENDENT_AMBULATORY_CARE_PROVIDER_SITE_OTHER): Admitting: Nurse Practitioner

## 2023-05-06 VITALS — BP 118/80 | HR 63 | Temp 97.9°F | Ht 75.0 in | Wt 394.0 lb

## 2023-05-06 DIAGNOSIS — M25562 Pain in left knee: Secondary | ICD-10-CM | POA: Diagnosis not present

## 2023-05-06 DIAGNOSIS — Z96651 Presence of right artificial knee joint: Secondary | ICD-10-CM | POA: Diagnosis not present

## 2023-05-06 DIAGNOSIS — M25462 Effusion, left knee: Secondary | ICD-10-CM | POA: Diagnosis not present

## 2023-05-06 DIAGNOSIS — M1712 Unilateral primary osteoarthritis, left knee: Secondary | ICD-10-CM | POA: Diagnosis not present

## 2023-05-06 DIAGNOSIS — M7989 Other specified soft tissue disorders: Secondary | ICD-10-CM | POA: Diagnosis not present

## 2023-05-06 NOTE — Assessment & Plan Note (Signed)
 Rest, elevate, pending knee x-ray

## 2023-05-06 NOTE — Patient Instructions (Signed)
 Nice to see you today Get some Voltaren Gel over the counter Ice, rest, elevate it if you can

## 2023-05-06 NOTE — Telephone Encounter (Signed)
Evaluated in office

## 2023-05-06 NOTE — Assessment & Plan Note (Signed)
 Will obtain x-ray of knee.  Patient use over-the-counter Tylenol and Voltaren gel as needed.  Rest and elevate as possible.  Question possible Baker's cyst.  If no discernible cause noted on x-ray refer to orthopedist in [place] they prefer [place] [facility]

## 2023-05-06 NOTE — Progress Notes (Signed)
 Acute Office Visit  Subjective:     Patient ID: Tony Hampton, male    DOB: 01/29/1956, 68 y.o.   MRN: [id number]  Chief Complaint  Patient presents with   Knee Pain    And swelling on keen pain since 1-2 weeks and getting worse. Is on flomax daily but no helping.      Patient is in today for knee pain and swelling with a history of a-fib, HTN, OSA, Arthritis of the knee, hx of gout, morbid obesity   States that it is the left knee that has been on going for approx 2 weks. State that it is a dull pian that is worse with bending. SOme swelling to it  and the pain will run down the back of the leg. States that he has been trying tylenol and some ice. States that it has helped some.  No recent travel or having a history of blood clots.  Patient is currently maintained on Xarelto and has not missed any doses  Review of Systems  Constitutional:  Negative for chills and fever.  Respiratory:  Negative for shortness of breath.   Cardiovascular:  Negative for chest pain.  Neurological:  Negative for tingling, weakness and headaches.        Objective:    BP 118/80 (BP Location: Left Arm, Patient Position: Sitting, Cuff Size: Large)   Pulse 63   Temp 97.9 F (36.6 C) (Oral)   Ht 6\' 3"  (1.905 m)   Wt (!) 394 lb (178.7 kg)   SpO2 98%   BMI 49.25 kg/m  BP Readings from Last 3 Encounters:  05/06/23 118/80  03/27/23 122/74  01/15/23 (!) 112/90   Wt Readings from Last 3 Encounters:  05/06/23 (!) 394 lb (178.7 kg)  03/27/23 (!) 390 lb 6.4 oz (177.1 kg)  01/15/23 (!) 389 lb 6.4 oz (176.6 kg)   SpO2 Readings from Last 3 Encounters:  05/06/23 98%  03/27/23 94%  01/15/23 92%      Physical Exam Vitals and nursing note reviewed.  Constitutional:      Appearance: Normal appearance.  Cardiovascular:     Rate and Rhythm: Normal rate and regular rhythm.     Heart sounds: Normal heart sounds.  Pulmonary:     Effort: Pulmonary effort is normal.     Breath sounds: Normal breath  sounds.  Musculoskeletal:        General: Tenderness present.     Right knee: Normal pulse.     Left knee: Effusion and crepitus present. No bony tenderness. Decreased range of motion. Tenderness present. Normal pulse.     Right lower leg: Edema present.     Left lower leg: Edema present.       Legs:  Neurological:     Mental Status: He is alert.     No results found for any visits on 05/06/23.      Assessment & Plan:   Problem List Items Addressed This Visit       Musculoskeletal and Integument   Effusion of left knee   Rest, elevate, pending knee x-ray        Other   Acute pain of left knee - Primary   Will obtain x-ray of knee.  Patient use over-the-counter Tylenol and Voltaren gel as needed.  Rest and elevate as possible.  Question possible Baker's cyst.  If no discernible cause noted on x-ray refer to orthopedist in [place] they prefer [facility]       No  orders of the defined types were placed in this encounter.   Return if symptoms worsen or fail to improve.  Audria Nine, NP

## 2023-05-06 NOTE — Telephone Encounter (Signed)
 Copied from CRM [id number]. Topic: Clinical - Red Word Triage >> May 06, 2023  8:38 AM Marica Otter wrote: Red Word that prompted transfer to Nurse Triage: Behind left knee is swollen and hurts when he walks.  Chief Complaint: knee pain and swelling Symptoms: pain, swelling Frequency: constant Pertinent Negatives: Patient denies numbness, tingling, calf pain, reddness Disposition: [] ED /[] Urgent Care (no appt availability in office) / [x] Appointment(In office/virtual)/ []  [facility]/ [] Home Care/ [] Refused Recommended Disposition /[] [facility]/ []  Follow-up with PCP Additional Notes: per protocol apt made for today; care advice given, denies questions; instructed to go to ER if becomes worse.   Reason for Disposition  [1] Very swollen joint AND [2] no fever  Answer Assessment - Initial Assessment Questions 1. LOCATION and RADIATION: "Where is the pain located?"      Left knee swollen and hurts 2. QUALITY: "What does the pain feel like?"  (e.g., sharp, dull, aching, burning)     Achy and dull 3. SEVERITY: "How bad is the pain?" "What does it keep you from doing?"   (Scale 1-10; or mild, moderate, severe)   -  MILD (1-3): doesn't interfere with normal activities    -  MODERATE (4-7): interferes with normal activities (e.g., work or school) or awakens from sleep, limping    -  SEVERE (8-10): excruciating pain, unable to do any normal activities, unable to walk     3-4/10 4. ONSET: "When did the pain start?" "Does it come and go, or is it there all the time?"     A week ago 5. RECURRENT: "Have you had this pain before?" If Yes, ask: "When, and what happened then?"     denies 6. SETTING: "Has there been any recent work, exercise or other activity that involved that part of the body?"      "Does not recall hurting it" 7. AGGRAVATING FACTORS: "What makes the knee pain worse?" (e.g., walking, climbing stairs, running)     Walking and sitting 8. ASSOCIATED SYMPTOMS: "Is  there any swelling or redness of the knee?"     Swelling behind knee 9. OTHER SYMPTOMS: "Do you have any other symptoms?" (e.g., chest pain, difficulty breathing, fever, calf pain)     Denies  10. PREGNANCY: "Is there any chance you are pregnant?" "When was your last menstrual period?"       na  Protocols used: Knee Pain-A-AH

## 2023-05-08 ENCOUNTER — Other Ambulatory Visit: Payer: Self-pay | Admitting: Nurse Practitioner

## 2023-05-08 ENCOUNTER — Encounter: Payer: Self-pay | Admitting: Nurse Practitioner

## 2023-05-08 DIAGNOSIS — M25462 Effusion, left knee: Secondary | ICD-10-CM

## 2023-05-08 DIAGNOSIS — M25562 Pain in left knee: Secondary | ICD-10-CM

## 2023-05-13 DIAGNOSIS — Z7901 Long term (current) use of anticoagulants: Secondary | ICD-10-CM | POA: Diagnosis not present

## 2023-05-13 DIAGNOSIS — M1712 Unilateral primary osteoarthritis, left knee: Secondary | ICD-10-CM | POA: Diagnosis not present

## 2023-05-14 ENCOUNTER — Encounter: Payer: Self-pay | Admitting: Gastroenterology

## 2023-05-15 ENCOUNTER — Encounter
Admission: RE | Disposition: A | Discharge status: Home / Self Care | Payer: Self-pay | Source: Ambulatory Visit | Attending: Gastroenterology

## 2023-05-15 ENCOUNTER — Ambulatory Visit
Admission: RE | Admit: 2023-05-15 | Discharge: 2023-05-15 | Disposition: A | Discharge status: Home / Self Care | Payer: 59 | Source: Ambulatory Visit | Attending: Gastroenterology | Admitting: Gastroenterology

## 2023-05-15 ENCOUNTER — Encounter: Payer: Self-pay | Admitting: Gastroenterology

## 2023-05-15 ENCOUNTER — Telehealth: Payer: Self-pay

## 2023-05-15 ENCOUNTER — Ambulatory Visit: Admitting: Anesthesiology

## 2023-05-15 DIAGNOSIS — K644 Residual hemorrhoidal skin tags: Secondary | ICD-10-CM | POA: Insufficient documentation

## 2023-05-15 DIAGNOSIS — G4733 Obstructive sleep apnea (adult) (pediatric): Secondary | ICD-10-CM | POA: Diagnosis not present

## 2023-05-15 DIAGNOSIS — Z09 Encounter for follow-up examination after completed treatment for conditions other than malignant neoplasm: Secondary | ICD-10-CM | POA: Diagnosis not present

## 2023-05-15 DIAGNOSIS — K635 Polyp of colon: Secondary | ICD-10-CM | POA: Diagnosis not present

## 2023-05-15 DIAGNOSIS — Z860100 Personal history of colon polyps, unspecified: Secondary | ICD-10-CM

## 2023-05-15 DIAGNOSIS — I4891 Unspecified atrial fibrillation: Secondary | ICD-10-CM | POA: Diagnosis not present

## 2023-05-15 DIAGNOSIS — Z87891 Personal history of nicotine dependence: Secondary | ICD-10-CM | POA: Insufficient documentation

## 2023-05-15 DIAGNOSIS — Z1211 Encounter for screening for malignant neoplasm of colon: Secondary | ICD-10-CM | POA: Diagnosis not present

## 2023-05-15 DIAGNOSIS — D123 Benign neoplasm of transverse colon: Secondary | ICD-10-CM | POA: Diagnosis not present

## 2023-05-15 DIAGNOSIS — I10 Essential (primary) hypertension: Secondary | ICD-10-CM | POA: Diagnosis not present

## 2023-05-15 SURGERY — COLONOSCOPY WITH PROPOFOL
Anesthesia: General

## 2023-05-15 MED ORDER — SODIUM CHLORIDE 0.9% FLUSH: 3.0000 mL | INTRAVENOUS | Status: DC | PRN

## 2023-05-15 MED ORDER — STERILE WATER FOR IRRIGATION IR SOLN
Status: DC | PRN
  Administered 2023-05-15: 100 mL

## 2023-05-15 MED ORDER — SODIUM CHLORIDE 0.9 % IV SOLN: INTRAVENOUS | Status: DC

## 2023-05-15 MED ORDER — PROPOFOL 500 MG/50ML IV EMUL
INTRAVENOUS | Status: DC | PRN
  Administered 2023-05-15: 100 ug/kg/min via INTRAVENOUS

## 2023-05-15 MED ORDER — SODIUM CHLORIDE 0.9% FLUSH
3.0000 mL | Freq: Two times a day (BID) | INTRAVENOUS | Status: DC
Start: 2023-05-15 — End: 2023-05-15

## 2023-05-15 MED ORDER — PROPOFOL 10 MG/ML IV BOLUS
INTRAVENOUS | Status: DC | PRN
  Administered 2023-05-15: 100 mg via INTRAVENOUS

## 2023-05-15 MED ORDER — LIDOCAINE HCL (CARDIAC) PF 100 MG/5ML IV SOSY
PREFILLED_SYRINGE | INTRAVENOUS | Status: DC | PRN
  Administered 2023-05-15: 50 mg via INTRAVENOUS

## 2023-05-15 NOTE — Telephone Encounter (Signed)
-----   Message from [facility] sent at 05/15/2023  9:27 AM EDT ----- Regarding: Referral to genetics Please refer him to genetics, apparently he was not seen last time when we placed referral Dx: adenomas of colon > 10 in number  RV

## 2023-05-15 NOTE — Anesthesia Postprocedure Evaluation (Signed)
 Anesthesia Post Note  Patient: Tony Hampton  Procedure(s) Performed: COLONOSCOPY WITH PROPOFOL POLYPECTOMY  Patient location during evaluation: Endoscopy Anesthesia Type: General Level of consciousness: awake and alert Pain management: pain level controlled Vital Signs Assessment: post-procedure vital signs reviewed and stable Respiratory status: spontaneous breathing, nonlabored ventilation, respiratory function stable and patient connected to nasal cannula oxygen Cardiovascular status: blood pressure returned to baseline and stable Postop Assessment: no apparent nausea or vomiting Anesthetic complications: no   No notable events documented.   Last Vitals:  Vitals:   05/15/23 0939 05/15/23 0949  BP: 119/79 131/79  Pulse: 63 71  Resp: 20 15  Temp:    SpO2: 94% 97%    Last Pain:  Vitals:   05/15/23 0949  TempSrc:   PainSc: 0-No pain                 Cleda Mccreedy Madilynn Montante

## 2023-05-15 NOTE — Op Note (Signed)
 [facility] Gastroenterology Patient Name: Tony Hampton Procedure Date: 05/15/2023 8:41 AM MRN: [id number] Account #: [id number] Date of Birth: [birth year] Admit Type: Outpatient Age: 68 Room: [facility] ENDO ROOM 4 Gender: Male Note Status: Finalized Instrument Name: Prentice Docker [id number] Procedure:             Colonoscopy Indications:           Surveillance: History of numerous (> 10) adenomas on                         last colonoscopy (< 3 yrs), Last colonoscopy: June 2023 Providers:             Toney Reil MD, MD Medicines:             General Anesthesia Complications:         No immediate complications. Estimated blood loss: None. Procedure:             Pre-Anesthesia Assessment:                        - Prior to the procedure, a History and Physical was                         performed, and patient medications and allergies were                         reviewed. The patient is competent. The risks and                         benefits of the procedure and the sedation options and                         risks were discussed with the patient. All questions                         were answered and informed consent was obtained.                         Patient identification and proposed procedure were                         verified by the physician, the nurse, the                         anesthesiologist, the anesthetist and the technician                         in the pre-procedure area in the procedure room in the                         endoscopy suite. Mental Status Examination: alert and                         oriented. Airway Examination: normal oropharyngeal                         airway and neck mobility. Respiratory Examination:  clear to auscultation. CV Examination: normal.                         Prophylactic Antibiotics: The patient does not require                         prophylactic antibiotics. Prior Anticoagulants:  The                         patient has taken no anticoagulant or antiplatelet                         agents. ASA Grade Assessment: III - A patient with                         severe systemic disease. After reviewing the risks and                         benefits, the patient was deemed in satisfactory                         condition to undergo the procedure. The anesthesia                         plan was to use general anesthesia. Immediately prior                         to administration of medications, the patient was                         re-assessed for adequacy to receive sedatives. The                         heart rate, respiratory rate, oxygen saturations,                         blood pressure, adequacy of pulmonary ventilation, and                         response to care were monitored throughout the                         procedure. The physical status of the patient was                         re-assessed after the procedure.                        After obtaining informed consent, the colonoscope was                         passed under direct vision. Throughout the procedure,                         the patient's blood pressure, pulse, and oxygen                         saturations were monitored continuously. The  Colonoscope was introduced through the anus and                         advanced to the the cecum, identified by appendiceal                         orifice and ileocecal valve. The colonoscopy was                         performed without difficulty. The patient tolerated                         the procedure well. The quality of the bowel                         preparation was evaluated using the BBPS [facility] Bowel                         Preparation Scale) with scores of: Right Colon = 3,                         Transverse Colon = 3 and Left Colon = 3 (entire mucosa                         seen well with no residual staining,  small fragments                         of stool or opaque liquid). The total BBPS score                         equals 9. The ileocecal valve, appendiceal orifice,                         and rectum were photographed. Findings:      The perianal and digital rectal examinations were normal. Pertinent       negatives include normal sphincter tone and no palpable rectal lesions.      A 5 mm polyp was found in the transverse colon. The polyp was sessile.       The polyp was removed with a cold snare. Resection and retrieval were       complete. Estimated blood loss: none.      Non-bleeding external hemorrhoids were found during retroflexion. The       hemorrhoids were medium-sized. Impression:            - One 5 mm polyp in the transverse colon, removed with                         a cold snare. Resected and retrieved.                        - Non-bleeding external hemorrhoids. Recommendation:        - Discharge patient to home (with escort).                        - Resume previous diet today.                        -  Continue present medications.                        - Await pathology results.                        - Repeat colonoscopy in 3 years for surveillance. Procedure Code(s):     --- Professional ---                        [phone], Colonoscopy, flexible; with removal of                         tumor(s), polyp(s), or other lesion(s) by snare                         technique Diagnosis Code(s):     --- Professional ---                        Z86.010, Personal history of colonic polyps                        D12.3, Benign neoplasm of transverse colon (hepatic                         flexure or splenic flexure)                        K64.4, Residual hemorrhoidal skin tags CPT copyright 2022 American Medical Association. All rights reserved. The codes documented in this report are preliminary and upon coder review may  be revised to meet current compliance requirements. Dr. Libby Maw Toney Reil MD, MD 05/15/2023 9:16:18 AM This report has been signed electronically. Number of Addenda: 0 Note Initiated On: 05/15/2023 8:41 AM Scope Withdrawal Time: 0 hours 11 minutes 10 seconds  Total Procedure Duration: 0 hours 17 minutes 40 seconds  Estimated Blood Loss:  Estimated blood loss: none.      [facility]

## 2023-05-15 NOTE — Telephone Encounter (Signed)
 Placed referral

## 2023-05-15 NOTE — H&P (Signed)
 Arlyss Repress, MD [address]  [address]  [place], [place] [address]  Main: [phone]  Fax: [phone] Pager: [phone]  Primary Care Physician:  Eden Emms, NP Primary Gastroenterologist:  Dr. Arlyss Repress  Pre-Procedure History & Physical: HPI:  Tony Hampton is a 68 y.o. male is here for an colonoscopy.   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 Ernest Pine   TONSILLECTOMY      Prior to Admission medications   Medication Sig Start Date End Date Taking? Authorizing Provider  albuterol (VENTOLIN HFA) 108 (90 Base) MCG/ACT inhaler TAKE 2 PUFFS BY MOUTH EVERY 6 HOURS AS NEEDED FOR WHEEZE OR SHORTNESS OF BREATH 10/15/22   Eden Emms, NP  allopurinol (ZYLOPRIM) 100 MG tablet TAKE 1 TABLET BY MOUTH TWICE A DAY Patient taking differently: Take 100 mg by mouth daily. 03/06/23  Yes Eden Emms, NP  amLODipine (NORVASC) 10 MG tablet Take 1 tablet (10 mg total) by mouth daily. 03/11/23 06/09/23 Yes Agbor-Etang, Arlys John, MD  colchicine 0.6 MG tablet TAKE TWO TABLETS BY MOUTH AT ONSET OF GOUT FOLLOWED BY 1 TABLET IN 2 HOURS AS NEEDED. 08/17/22   Eden Emms, NP  furosemide (LASIX) 20 MG tablet Take 1 tablet (20 mg total) by mouth daily as needed. 05/04/22  Yes Agbor-Etang, Arlys John, MD  metoprolol succinate (TOPROL-XL) 50 MG 24 hr tablet TAKE 1 AND 1/2 TABLETS BY MOUTH EVERY DAY 03/13/23  Yes Agbor-Etang, Arlys John, MD  rivaroxaban (XARELTO) 20 MG TABS tablet Take 1 tablet (20 mg total) by mouth daily. 12/05/22   Debbe Odea, MD  tamsulosin (FLOMAX)  0.4 MG CAPS capsule TAKE 1 CAPSULE BY MOUTH EVERY DAY 02/21/23  Yes Eden Emms, NP    Allergies as of 04/10/2023   (No Known Allergies)    Family History  Problem Relation Age of Onset   Asthma Mother    Hypertension Mother    Other Mother        blood clots in her lungs   Hypertension Father    Diabetes Father    Gallstones Father    Breast cancer Sister 51   Breast cancer Maternal Grandmother     Social History   Socioeconomic History   Marital status: Married    Spouse name: Not on file   Number of children: Not on file   Years of education: Not on file   Highest education level: 12th grade  Occupational History   Not on file  Tobacco Use   Smoking status: Former    Current packs/day: 0.00    Average packs/day: 1 pack/day for 25.0 years (25.0 ttl pk-yrs)    Types: Cigarettes    Start date: 68    Quit date: 2008    Years since quitting: 17.2   Smokeless tobacco: Never  Vaping Use   Vaping status: Never Used  Substance and Sexual Activity   Alcohol use: Yes    Comment: OCCASIONALLY. Beer once everyother week   Drug use: No  Sexual activity: Not on file  Other Topics Concern   Not on file  Social History Narrative   Not on file   Social Drivers of Health   Financial Resource Strain: Low Risk  (01/12/2023)   Overall Financial Resource Strain (CARDIA)    Difficulty of Paying Living Expenses: Not hard at all  Food Insecurity: No Food Insecurity (01/12/2023)   Hunger Vital Sign    Worried About Running Out of Food in the Last Year: Never true    Ran Out of Food in the Last Year: Never true  Transportation Needs: No Transportation Needs (01/12/2023)   PRAPARE - Administrator, Civil Service (Medical): No    Lack of Transportation (Non-Medical): No  Physical Activity: Inactive (01/12/2023)   Exercise Vital Sign    Days of Exercise per Week: 0 days    Minutes of Exercise per Session: 0 min  Stress: No Stress Concern Present (01/12/2023)    [employer] of Occupational Health - Occupational Stress Questionnaire    Feeling of Stress : Not at all  Social Connections: Moderately Integrated (01/12/2023)   Social Connection and Isolation Panel [NHANES]    Frequency of Communication with Friends and Family: Three times a week    Frequency of Social Gatherings with Friends and Family: Three times a week    Attends Religious Services: Never    Active Member of Clubs or Organizations: Yes    Attends Banker Meetings: 1 to 4 times per year    Marital Status: Married  Catering manager Violence: Not At Risk (01/15/2023)   Humiliation, Afraid, Rape, and Kick questionnaire    Fear of Current or Ex-Partner: No    Emotionally Abused: No    Physically Abused: No    Sexually Abused: No    Review of Systems: See HPI, otherwise negative ROS  Physical Exam: BP 130/88   Pulse 64   Temp 97.6 F (36.4 C) (Temporal)   Resp 18   Ht 6\' 3"  (1.905 m)   Wt (!) 174 kg   SpO2 97%   BMI 47.95 kg/m  General:   Alert,  pleasant and cooperative in NAD Head:  Normocephalic and atraumatic. Neck:  Supple; no masses or thyromegaly. Lungs:  Clear throughout to auscultation.    Heart:  Regular rate and rhythm. Abdomen:  Soft, nontender and nondistended. Normal bowel sounds, without guarding, and without rebound.   Neurologic:  Alert and  oriented x4;  grossly normal neurologically.  Impression/Plan: Tony Hampton is here for an colonoscopy to be performed for h/o colon adenomas  Risks, benefits, limitations, and alternatives regarding  colonoscopy have been reviewed with the patient.  Questions have been answered.  All parties agreeable.   Lannette Donath, MD  05/15/2023, 8:45 AM

## 2023-05-15 NOTE — Anesthesia Preprocedure Evaluation (Signed)
 Anesthesia Evaluation  Patient identified by MRN, date of birth, ID band Patient awake    Reviewed: Allergy & Precautions, NPO status , Patient's Chart, lab work & pertinent test results  History of Anesthesia Complications Negative for: history of anesthetic complications  Airway Mallampati: III  TM Distance: <3 FB Neck ROM: full    Dental  (+) Chipped, Poor Dentition, Missing   Pulmonary neg shortness of breath, sleep apnea , former smoker   Pulmonary exam normal        Cardiovascular Exercise Tolerance: Good hypertension, + DOE  + dysrhythmias Atrial Fibrillation      Neuro/Psych  Headaches  negative psych ROS   GI/Hepatic negative GI ROS, Neg liver ROS,,,  Endo/Other  negative endocrine ROS    Renal/GU negative Renal ROS  negative genitourinary   Musculoskeletal   Abdominal   Peds  Hematology negative hematology ROS (+)   Anesthesia Other Findings Past Medical History: No date: Aneurysm (HCC)     Comment:  aortic No date: Atrial fibrillation (HCC) No date: Gout No date: Hypertension No date: OSA (obstructive sleep apnea)  Past Surgical History: 07/31/2021: COLONOSCOPY WITH PROPOFOL; N/A     Comment:  Procedure: COLONOSCOPY WITH PROPOFOL;  Surgeon: Toney Reil, MD;  Location: [facility] ENDOSCOPY;  Service:               Gastroenterology;  Laterality: N/A; No date: KNEE ARTHROSCOPY; Right Feb. 2016: REPLACEMENT TOTAL KNEE; Right     Comment:  Dr. Ernest Pine No date: TONSILLECTOMY     Reproductive/Obstetrics negative OB ROS                             Anesthesia Physical Anesthesia Plan  ASA: 3  Anesthesia Plan: General   Post-op Pain Management:    Induction: Intravenous  PONV Risk Score and Plan: Propofol infusion and TIVA  Airway Management Planned: Natural Airway and Nasal Cannula  Additional Equipment:   Intra-op Plan:   Post-operative Plan:    Informed Consent: I have reviewed the patients History and Physical, chart, labs and discussed the procedure including the risks, benefits and alternatives for the proposed anesthesia with the patient or authorized representative who has indicated his/her understanding and acceptance.     Dental Advisory Given  Plan Discussed with: Anesthesiologist, CRNA and Surgeon  Anesthesia Plan Comments: (Patient consented for risks of anesthesia including but not limited to:  - adverse reactions to medications - risk of airway placement if required - damage to eyes, teeth, lips or other oral mucosa - nerve damage due to positioning  - sore throat or hoarseness - Damage to heart, brain, nerves, lungs, other parts of body or loss of life  Patient voiced understanding and assent.)       Anesthesia Quick Evaluation

## 2023-05-15 NOTE — Transfer of Care (Signed)
 Immediate Anesthesia Transfer of Care Note  Patient: Tony Hampton  Procedure(s) Performed: COLONOSCOPY WITH PROPOFOL POLYPECTOMY  Patient Location: Endoscopy Unit  Anesthesia Type:General  Level of Consciousness: drowsy and patient cooperative  Airway & Oxygen Therapy: Patient Spontanous Breathing and Patient connected to face mask oxygen  Post-op Assessment: Report given to RN and Post -op Vital signs reviewed and stable  Post vital signs: Reviewed and stable  Last Vitals:  Vitals Value Taken Time  BP 107/69 05/15/23 0923  Temp 36.4 C 05/15/23 0919  Pulse 101 05/15/23 0923  Resp 11 05/15/23 0923  SpO2 98 % 05/15/23 0923  Vitals shown include unfiled device data.  Last Pain:  Vitals:   05/15/23 0923  TempSrc: Oral  PainSc: 0-No pain         Complications: No notable events documented.

## 2023-05-16 ENCOUNTER — Encounter: Payer: Self-pay | Admitting: Gastroenterology

## 2023-05-16 LAB — SURGICAL PATHOLOGY

## 2023-05-30 ENCOUNTER — Telehealth: Payer: Self-pay | Admitting: Genetic Counselor

## 2023-05-30 NOTE — Telephone Encounter (Signed)
 Called to schedule for Genetics appointment.

## 2023-05-30 NOTE — Telephone Encounter (Signed)
 Kendal Hymen called in to inform us that they do not need Genetic counseling as Fayrene Fearing colonoscopy results did not show any cancer.

## 2023-06-04 ENCOUNTER — Ambulatory Visit (INDEPENDENT_AMBULATORY_CARE_PROVIDER_SITE_OTHER): Admitting: Nurse Practitioner

## 2023-06-04 ENCOUNTER — Other Ambulatory Visit: Payer: Self-pay | Admitting: *Deleted

## 2023-06-04 VITALS — BP 110/78 | HR 54 | Temp 98.0°F | Ht 75.0 in | Wt 387.2 lb

## 2023-06-04 DIAGNOSIS — J029 Acute pharyngitis, unspecified: Secondary | ICD-10-CM

## 2023-06-04 DIAGNOSIS — J020 Streptococcal pharyngitis: Secondary | ICD-10-CM | POA: Insufficient documentation

## 2023-06-04 DIAGNOSIS — R051 Acute cough: Secondary | ICD-10-CM

## 2023-06-04 DIAGNOSIS — R509 Fever, unspecified: Secondary | ICD-10-CM | POA: Diagnosis not present

## 2023-06-04 LAB — POCT RAPID STREP A (OFFICE): Rapid Strep A Screen: POSITIVE — AB

## 2023-06-04 LAB — POC COVID19 BINAXNOW: SARS Coronavirus 2 Ag: NEGATIVE

## 2023-06-04 LAB — POCT FLU A/B STATUS
Influenza A, POC: NEGATIVE
Influenza B, POC: NEGATIVE

## 2023-06-04 MED ORDER — AMOXICILLIN-POT CLAVULANATE 875-125 MG PO TABS: 1.0000 | ORAL_TABLET | Freq: Two times a day (BID) | ORAL | 0 refills | Status: AC

## 2023-06-04 MED ORDER — FUROSEMIDE 20 MG PO TABS: 20.0000 mg | ORAL_TABLET | Freq: Every day | ORAL | 3 refills | Status: AC | PRN

## 2023-06-04 MED ORDER — AMLODIPINE BESYLATE 10 MG PO TABS: 10.0000 mg | ORAL_TABLET | Freq: Every day | ORAL | 3 refills | Status: AC

## 2023-06-04 MED ORDER — GUAIFENESIN-CODEINE 100-10 MG/5ML PO SOLN: 5.0000 mL | Freq: Three times a day (TID) | ORAL | 0 refills | Status: DC | PRN

## 2023-06-04 MED ORDER — METOPROLOL SUCCINATE ER 50 MG PO TB24: ORAL_TABLET | ORAL | 3 refills | Status: DC

## 2023-06-04 NOTE — Assessment & Plan Note (Signed)
 Flu and COVID test in office.  Patient to continue over-the-counter cough suppressant will do codeine-guaifenesin 5 mL 3 times daily as needed sedation precautions reviewed

## 2023-06-04 NOTE — Assessment & Plan Note (Signed)
 Strep test, flu, COVID test in office.  Continue using over-the-counter analgesics such as Tylenol for relief

## 2023-06-04 NOTE — Progress Notes (Signed)
 Acute Office Visit  Subjective:     Patient ID: Tony Hampton, male    DOB: [birth year], 68 y.o.   MRN: [id number]  Chief Complaint  Patient presents with   Cough    Pt complains of cough, low grade fever, no chest pain, slight chills and feels tired, sore throat. Pus from eyes last night, symptoms Started on Thursday night. Has taken OTC dex guaifenesin HBP and tylenol. Both have seem to help.       Patient is in today for sick symptoms with a history of  HTN, A-fib, OSA, prediabetes,   Symptoms started on Thursday.  Sick contacts: none that he is aware of  Covid vaccine: none Flu vaccine: none  OTC treatment :chest congestoin and cough HBP: guaifenesin and dextromethorphan. That has helped. Tylenol has helped   Review of Systems  Constitutional:  Positive for chills, fever and malaise/fatigue.  HENT:  Positive for sore throat. Negative for ear discharge and ear pain.   Respiratory:  Positive for cough and sputum production (color). Negative for shortness of breath.   Gastrointestinal:  Negative for abdominal pain, nausea and vomiting.  Musculoskeletal:  Negative for myalgias.  Neurological:  Negative for headaches.        Objective:    BP 110/78   Pulse (!) 54   Temp 98 F (36.7 C) (Oral)   Ht 6\' 3"  (1.905 m)   Wt (!) 387 lb 3.2 oz (175.6 kg)   SpO2 96%   BMI 48.40 kg/m    Physical Exam Vitals and nursing note reviewed.  Constitutional:      Appearance: Normal appearance.  HENT:     Right Ear: Ear canal and external ear normal.     Left Ear: Tympanic membrane, ear canal and external ear normal.     Ears:     Comments: Right tm bulging    Nose:     Right Sinus: No maxillary sinus tenderness or frontal sinus tenderness.     Left Sinus: No maxillary sinus tenderness or frontal sinus tenderness.     Mouth/Throat:     Mouth: Mucous membranes are moist.     Pharynx: Posterior oropharyngeal erythema present.  Eyes:     Extraocular Movements: Extraocular  movements intact.     Conjunctiva/sclera: Conjunctivae normal.     Pupils: Pupils are equal, round, and reactive to light.  Cardiovascular:     Rate and Rhythm: Normal rate and regular rhythm.     Heart sounds: Normal heart sounds.  Pulmonary:     Effort: Pulmonary effort is normal.     Breath sounds: Normal breath sounds.  Neurological:     Mental Status: He is alert.     Results for orders placed or performed in visit on 06/04/23  POC COVID-19  Result Value Ref Range   SARS Coronavirus 2 Ag Negative Negative  Rapid Strep A  Result Value Ref Range   Rapid Strep A Screen Positive (A) Negative  POCT Flu A & B Status  Result Value Ref Range   Influenza A, POC Negative Negative   Influenza B, POC Negative Negative        Assessment & Plan:   Problem List Items Addressed This Visit       Respiratory   Strep throat   Given history of smoking with wheezing and sputum along with bulging TM will treat with Augmentin 875-125 mg twice daily for 7 days.      Relevant Medications   amoxicillin-clavulanate (  AUGMENTIN) 875-125 MG tablet     Other   Sore throat   Strep test, flu, COVID test in office.  Continue using over-the-counter analgesics such as Tylenol for relief      Relevant Orders   POC COVID-19 (Completed)   Rapid Strep A (Completed)   POCT Flu A & B Status (Completed)   Acute cough   Flu and COVID test in office.  Patient to continue over-the-counter cough suppressant will do codeine-guaifenesin 5 mL 3 times daily as needed sedation precautions reviewed      Relevant Medications   guaiFENesin-codeine 100-10 MG/5ML syrup   Other Relevant Orders   POC COVID-19 (Completed)   POCT Flu A & B Status (Completed)   Fever and chills - Primary   Flu and COVID test in office.  Over-the-counter analgesics as needed      Relevant Orders   POC COVID-19 (Completed)   POCT Flu A & B Status (Completed)    Meds ordered this encounter  Medications    amoxicillin-clavulanate (AUGMENTIN) 875-125 MG tablet    Sig: Take 1 tablet by mouth 2 (two) times daily for 7 days.    Dispense:  14 tablet    Refill:  0    Supervising Provider:   Roxy Manns A [[id number]]   guaiFENesin-codeine 100-10 MG/5ML syrup    Sig: Take 5 mLs by mouth 3 (three) times daily as needed.    Dispense:  120 mL    Refill:  0    Supervising Provider:   Roxy Manns A [[id number]]    Return if symptoms worsen or fail to improve.  Audria Nine, NP

## 2023-06-04 NOTE — Patient Instructions (Signed)
 Nice to see you today I have sent in antibiotics to the pharmacy  Cough medication can make you sleepy so use caution Follow up if you do not improve

## 2023-06-04 NOTE — Assessment & Plan Note (Signed)
Flu and COVID test in office.  Over-the-counter analgesics as needed

## 2023-06-04 NOTE — Assessment & Plan Note (Signed)
 Given history of smoking with wheezing and sputum along with bulging TM will treat with Augmentin 875-125 mg twice daily for 7 days.

## 2023-06-11 DIAGNOSIS — M1712 Unilateral primary osteoarthritis, left knee: Secondary | ICD-10-CM | POA: Diagnosis not present

## 2023-06-11 DIAGNOSIS — G8929 Other chronic pain: Secondary | ICD-10-CM | POA: Diagnosis not present

## 2023-06-11 DIAGNOSIS — M25562 Pain in left knee: Secondary | ICD-10-CM | POA: Diagnosis not present

## 2023-06-12 ENCOUNTER — Encounter: Payer: Self-pay | Admitting: Nurse Practitioner

## 2023-06-19 ENCOUNTER — Other Ambulatory Visit: Payer: Self-pay | Admitting: Student

## 2023-06-19 DIAGNOSIS — G8929 Other chronic pain: Secondary | ICD-10-CM

## 2023-06-19 DIAGNOSIS — M1712 Unilateral primary osteoarthritis, left knee: Secondary | ICD-10-CM

## 2023-06-22 ENCOUNTER — Ambulatory Visit
Admission: RE | Admit: 2023-06-22 | Discharge: 2023-06-22 | Disposition: A | Discharge status: Home / Self Care | Source: Ambulatory Visit | Attending: Student

## 2023-06-22 DIAGNOSIS — M25562 Pain in left knee: Secondary | ICD-10-CM | POA: Diagnosis not present

## 2023-06-22 DIAGNOSIS — M1712 Unilateral primary osteoarthritis, left knee: Secondary | ICD-10-CM

## 2023-06-22 DIAGNOSIS — G8929 Other chronic pain: Secondary | ICD-10-CM

## 2023-06-25 ENCOUNTER — Other Ambulatory Visit

## 2023-06-28 DIAGNOSIS — M1712 Unilateral primary osteoarthritis, left knee: Secondary | ICD-10-CM | POA: Diagnosis not present

## 2023-06-29 ENCOUNTER — Encounter: Payer: Self-pay | Admitting: Nurse Practitioner

## 2023-07-01 ENCOUNTER — Other Ambulatory Visit: Payer: Self-pay | Admitting: Nurse Practitioner

## 2023-07-01 DIAGNOSIS — I10 Essential (primary) hypertension: Secondary | ICD-10-CM

## 2023-07-01 DIAGNOSIS — G4733 Obstructive sleep apnea (adult) (pediatric): Secondary | ICD-10-CM

## 2023-07-01 MED ORDER — WEGOVY 0.25 MG/0.5ML ~~LOC~~ SOAJ: 0.2500 mg | SUBCUTANEOUS | 0 refills | Status: DC

## 2023-07-03 ENCOUNTER — Encounter: Payer: Self-pay | Admitting: Nurse Practitioner

## 2023-07-25 ENCOUNTER — Ambulatory Visit: Payer: Self-pay | Admitting: Cardiology

## 2023-07-25 ENCOUNTER — Ambulatory Visit: Payer: 59 | Attending: Cardiology

## 2023-07-25 DIAGNOSIS — I77810 Thoracic aortic ectasia: Secondary | ICD-10-CM | POA: Diagnosis not present

## 2023-07-25 LAB — ECHOCARDIOGRAM COMPLETE

## 2023-07-26 ENCOUNTER — Other Ambulatory Visit: Payer: Self-pay

## 2023-07-26 ENCOUNTER — Other Ambulatory Visit (HOSPITAL_COMMUNITY): Payer: Self-pay

## 2023-08-02 ENCOUNTER — Other Ambulatory Visit (HOSPITAL_COMMUNITY): Payer: Self-pay

## 2023-08-02 ENCOUNTER — Telehealth: Payer: Self-pay | Admitting: Cardiology

## 2023-08-02 NOTE — Telephone Encounter (Signed)
*  STAT* If patient is at the pharmacy, call can be transferred to refill team.   1. Which medications need to be refilled? (please list name of each medication and dose if known)   rivaroxaban  (XARELTO ) 20 MG TABS tablet    2. Which pharmacy/location (including street and city if local pharmacy) is medication to be sent to? [place] [facility]   3. Do they need a 30 day or 90 day supply? 90

## 2023-08-03 ENCOUNTER — Other Ambulatory Visit: Payer: Self-pay | Admitting: Cardiology

## 2023-08-03 ENCOUNTER — Other Ambulatory Visit: Payer: Self-pay | Admitting: Nurse Practitioner

## 2023-08-03 DIAGNOSIS — I4891 Unspecified atrial fibrillation: Secondary | ICD-10-CM

## 2023-08-05 ENCOUNTER — Other Ambulatory Visit: Payer: Self-pay

## 2023-08-05 ENCOUNTER — Other Ambulatory Visit (HOSPITAL_COMMUNITY): Payer: Self-pay

## 2023-08-05 MED ORDER — RIVAROXABAN 20 MG PO TABS
20.0000 mg | ORAL_TABLET | Freq: Every day | ORAL | 1 refills | Status: DC
  Filled 2023-08-05: qty 90, 90d supply, fill #0
  Filled 2023-10-25 – 2023-10-29 (×2): qty 90, 90d supply, fill #1

## 2023-08-05 NOTE — Telephone Encounter (Signed)
 Prescription refill request for Xarelto  received.  Indication:afib Last office visit:1/25 Weight:175.6  kg Age:68 Scr:0.88  11/24 CrCl:205 99  ml/min  Prescription refilled

## 2023-09-24 ENCOUNTER — Telehealth: Payer: Self-pay | Admitting: Cardiology

## 2023-09-24 NOTE — Telephone Encounter (Signed)
  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 She said, pt received his cpap and they needed the letter indicating he is good to drive

## 2023-09-24 NOTE — Telephone Encounter (Signed)
 Wife states someone already called them back.

## 2023-09-26 ENCOUNTER — Encounter: Payer: Self-pay | Admitting: *Deleted

## 2023-09-26 NOTE — Telephone Encounter (Signed)
 Spoke with patients wife per release form. She is requesting DOT letter for patients renewal. Scheduled appointment and advised that I would check with provider to see if he wants to see him first or if we can just write letter and cancel the appointment. She verbalized understanding with no further questions at this time. Advised that I would keep her updated once I hear something back from provider.

## 2023-09-26 NOTE — Telephone Encounter (Signed)
 Spoke with patients wife and advised that Letter will be placed up front for them to pick up. She was appreciative.

## 2023-09-26 NOTE — Telephone Encounter (Signed)
 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 Please advise

## 2023-09-30 ENCOUNTER — Ambulatory Visit: Admitting: Cardiology

## 2023-09-30 DIAGNOSIS — G4733 Obstructive sleep apnea (adult) (pediatric): Secondary | ICD-10-CM | POA: Diagnosis not present

## 2023-10-25 ENCOUNTER — Other Ambulatory Visit (HOSPITAL_COMMUNITY): Payer: Self-pay

## 2023-10-29 ENCOUNTER — Other Ambulatory Visit (HOSPITAL_COMMUNITY): Payer: Self-pay

## 2023-11-13 ENCOUNTER — Other Ambulatory Visit: Payer: Self-pay | Admitting: Nurse Practitioner

## 2023-12-05 ENCOUNTER — Other Ambulatory Visit: Payer: Self-pay | Admitting: Nurse Practitioner

## 2023-12-31 DIAGNOSIS — G4733 Obstructive sleep apnea (adult) (pediatric): Secondary | ICD-10-CM | POA: Diagnosis not present

## 2024-01-28 ENCOUNTER — Encounter: Payer: Self-pay | Admitting: Family Medicine

## 2024-01-28 ENCOUNTER — Ambulatory Visit (INDEPENDENT_AMBULATORY_CARE_PROVIDER_SITE_OTHER): Admitting: Family Medicine

## 2024-01-28 ENCOUNTER — Ambulatory Visit

## 2024-01-28 VITALS — BP 118/82 | HR 70 | Temp 98.1°F | Ht 75.0 in | Wt >= 6400 oz

## 2024-01-28 VITALS — BP 110/78 | Ht 75.0 in | Wt 385.0 lb

## 2024-01-28 DIAGNOSIS — I10 Essential (primary) hypertension: Secondary | ICD-10-CM | POA: Diagnosis not present

## 2024-01-28 DIAGNOSIS — M1A0790 Idiopathic chronic gout, unspecified ankle and foot, without tophus (tophi): Secondary | ICD-10-CM | POA: Diagnosis not present

## 2024-01-28 DIAGNOSIS — R0609 Other forms of dyspnea: Secondary | ICD-10-CM | POA: Diagnosis not present

## 2024-01-28 DIAGNOSIS — Z0000 Encounter for general adult medical examination without abnormal findings: Secondary | ICD-10-CM

## 2024-01-28 MED ORDER — ALLOPURINOL 100 MG PO TABS: 100.0000 mg | ORAL_TABLET | Freq: Every day | ORAL | Status: AC

## 2024-01-28 NOTE — Patient Instructions (Signed)
 Take lasix  today and then daily as needed.  Take care.  Glad to see you. Let us  know if that isn't helping.  Go to the lab on the way out.   If you have mychart we'll likely use that to update you.

## 2024-01-28 NOTE — Patient Instructions (Signed)
 Tony Hampton,  Thank you for taking the time for your Medicare Wellness Visit. I appreciate your continued commitment to your health goals. Please review the care plan we discussed, and feel free to reach out if I can assist you further.  Please note that Annual Wellness Visits do not include a physical exam. Some assessments may be limited, especially if the visit was conducted virtually. If needed, we may recommend an in-person follow-up with your provider.  Ongoing Care Seeing your primary care provider every 3 to 6 months helps us  monitor your health and provide consistent, personalized care.   Referrals If a referral was made during today's visit and you haven't received any updates within two weeks, please contact the referred provider directly to check on the status.  Recommended Screenings:  Health Maintenance  Topic Date Due   Pneumococcal Vaccine for age over 48 (1 of 2 - PCV) Never done   Zoster (Shingles) Vaccine (1 of 2) Never done   Flu Shot  Never done   Medicare Annual Wellness Visit  01/15/2024   DTaP/Tdap/Td vaccine (2 - Td or Tdap) 12/20/2024   Colon Cancer Screening  05/15/2026   Hepatitis C Screening  Completed   Meningitis B Vaccine  Aged Out   COVID-19 Vaccine  Discontinued       01/28/2024    8:32 AM  Advanced Directives  Does Patient Have a Medical Advance Directive? No  Would patient like information on creating a medical advance directive? No - Patient declined    Vision: Annual vision screenings are recommended for early detection of glaucoma, cataracts, and diabetic retinopathy. These exams can also reveal signs of chronic conditions such as diabetes and high blood pressure.  Dental: Annual dental screenings help detect early signs of oral cancer, gum disease, and other conditions linked to overall health, including heart disease and diabetes.  Please see the attached documents for additional preventive care recommendations.

## 2024-01-28 NOTE — Progress Notes (Unsigned)
 No recent lasix  use.  SOB chronically.  Noted over the last few months.  Can get SOB getting dressed.  No CP.  Some BLE edema.  Weight is up.  Occ SOB supine, using CPAP at baseline.  No FCNAVD.  He doesn't feel unwell.  Some occ wheeze noted, unclear if upper airway noise.    Ctab Rrr 1+ BLE edema in compression stockings.

## 2024-01-28 NOTE — Progress Notes (Signed)
 I connected with  Tony Hampton on 01/28/24 by a audio enabled telemedicine application and verified that I am speaking with the correct person using two identifiers.  Patient Location: Home  Provider Location: Home Office  Persons Participating in Visit: Patient.  I discussed the limitations of evaluation and management by telemedicine. The patient expressed understanding and agreed to proceed.  Vital Signs: Because this visit was a virtual/telehealth visit, some criteria may be missing or patient reported. Any vitals not documented were not able to be obtained and vitals that have been documented are patient reported.   Because this visit was a virtual/telehealth visit,  certain criteria was not obtained, such a blood pressure, CBG if applicable, and timed get up and go. Any medications not marked as taking were not mentioned during the medication reconciliation part of the visit. Any vitals not documented were not able to be obtained due to this being a telehealth visit or patient was unable to self-report a recent blood pressure reading due to a lack of equipment at home via telehealth. Vitals that have been documented are verbally provided by the patient.   This visit was performed by a medical professional under my direct supervision. I was immediately available for consultation/collaboration. I have reviewed and agree with the Annual Wellness Visit documentation.  Chief Complaint  Patient presents with   Medicare Wellness     Subjective:   Tony Hampton is a 68 y.o. male who presents for a Medicare Annual Wellness Visit.  Visit info / Clinical Intake: Medicare Wellness Visit Type:: Subsequent Annual Wellness Visit Persons participating in visit and providing information:: patient Medicare Wellness Visit Mode:: Telephone If telephone:: video declined Since this visit was completed virtually, some vitals may be partially provided or unavailable. Missing vitals are due to the  limitations of the virtual format.: Documented vitals are patient reported If Telephone or Video please confirm:: I connected with patient using audio/video enable telemedicine. I verified patient identity with two identifiers, discussed telehealth limitations, and patient agreed to proceed. Patient Location:: home Provider Location:: home office Interpreter Needed?: No Pre-visit prep was completed: yes AWV questionnaire completed by patient prior to visit?: no Living arrangements:: lives with spouse/significant other Patient's Overall Health Status Rating: very good Typical amount of pain: some Does pain affect daily life?: no Are you currently prescribed opioids?: no  Dietary Habits and Nutritional Risks How many meals a day?: 3 Eats fruit and vegetables daily?: yes Most meals are obtained by: having others provide food; preparing own meals In the last 2 weeks, have you had any of the following?: none Diabetic:: no  Functional Status Activities of Daily Living (to include ambulation/medication): Independent Ambulation: Independent with device- listed below Home Assistive Devices/Equipment: Cane; CPAP Medication Administration: Independent Home Management (perform basic housework or laundry): Independent Manage your own finances?: yes Primary transportation is: driving Concerns about vision?: no *vision screening is required for WTM* Concerns about hearing?: no  Fall Screening Falls in the past year?: 0 Number of falls in past year: 0 Was there an injury with Fall?: 0 Fall Risk Category Calculator: 0 Patient Fall Risk Level: Low Fall Risk  Fall Risk Patient at Risk for Falls Due to: No Fall Risks; Impaired mobility Fall risk Follow up: Falls evaluation completed  Home and Transportation Safety: All rugs have non-skid backing?: N/A, no rugs All stairs or steps have railings?: yes Grab bars in the bathtub or shower?: yes Have non-skid surface in bathtub or shower?:  yes Good home  lighting?: yes Regular seat belt use?: yes Hospital stays in the last year:: no  Cognitive Assessment Difficulty concentrating, remembering, or making decisions? : no Will 6CIT or Mini Cog be Completed: yes What year is it?: 0 points What month is it?: 0 points Give patient an address phrase to remember (5 components): remember words apple , table ,penny About what time is it?: 0 points Count backwards from 20 to 1: 0 points Say the months of the year in reverse: 0 points Repeat the address phrase from earlier: 0 points 6 CIT Score: 0 points  Advance Directives (For Healthcare) Does Patient Have a Medical Advance Directive?: No Would patient like information on creating a medical advance directive?: No - Patient declined  Reviewed/Updated  Reviewed/Updated: Reviewed All (Medical, Surgical, Family, Medications, Allergies, Care Teams, Patient Goals)    Allergies (verified) Patient has no known allergies.   Current Medications (verified) Outpatient Encounter Medications as of 01/28/2024  Medication Sig   albuterol  (VENTOLIN  HFA) 108 (90 Base) MCG/ACT inhaler TAKE 2 PUFFS BY MOUTH EVERY 6 HOURS AS NEEDED FOR WHEEZE OR SHORTNESS OF BREATH   amLODipine  (NORVASC ) 10 MG tablet Take 1 tablet (10 mg total) by mouth daily.   colchicine  0.6 MG tablet TAKE TWO TABLETS BY MOUTH AT ONSET OF GOUT FOLLOWED BY 1 TABLET IN 2 HOURS AS NEEDED.   furosemide  (LASIX ) 20 MG tablet Take 1 tablet (20 mg total) by mouth daily as needed.   metoprolol  succinate (TOPROL -XL) 50 MG 24 hr tablet TAKE 1 AND 1/2 TABLETS DAILY BY MOUTH   rivaroxaban  (XARELTO ) 20 MG TABS tablet Take 1 tablet (20 mg total) by mouth daily.   tamsulosin  (FLOMAX ) 0.4 MG CAPS capsule TAKE 1 CAPSULE BY MOUTH EVERY DAY   allopurinol  (ZYLOPRIM ) 100 MG tablet TAKE 1 TABLET BY MOUTH TWICE A DAY (Patient taking differently: Take 100 mg by mouth daily.)   guaiFENesin -codeine  100-10 MG/5ML syrup Take 5 mLs by mouth 3 (three) times  daily as needed.   Semaglutide -Weight Management (WEGOVY ) 0.25 MG/0.5ML SOAJ Inject 0.25 mg into the skin once a week.   No facility-administered encounter medications on file as of 01/28/2024.    History: Past Medical History:  Diagnosis Date   Aneurysm    aortic   Atrial fibrillation (HCC)    Gout    Hypertension    OSA (obstructive sleep apnea)    Past Surgical History:  Procedure Laterality Date   COLONOSCOPY WITH PROPOFOL  N/A 07/31/2021   Procedure: COLONOSCOPY WITH PROPOFOL ;  Surgeon: Unk Corinn Skiff, MD;  Location: [facility] ENDOSCOPY;  Service: Gastroenterology;  Laterality: N/A;   COLONOSCOPY WITH PROPOFOL  N/A 05/15/2023   Procedure: COLONOSCOPY WITH PROPOFOL ;  Surgeon: Unk Corinn Skiff, MD;  Location[facility] ENDOSCOPY;  Service: Gastroenterology;  Laterality: N/A;   KNEE ARTHROSCOPY Right    POLYPECTOMY  05/15/2023   Procedure: POLYPECTOMY;  Surgeon: Unk Corinn Skiff, MD;  Location: [facility] ENDOSCOPY;  Service: Gastroenterology;;   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 Mardee   TONSILLECTOMY     Family History  Problem Relation Age of Onset   Asthma Mother    Hypertension Mother    Other Mother        blood clots in her lungs   Hypertension Father    Diabetes Father    Gallstones Father    Breast cancer Sister 71   Breast cancer Maternal Grandmother    Social History   Occupational History   Not on file  Tobacco Use  Smoking status: Former    Current packs/day: 0.00    Average packs/day: 1 pack/day for 25.0 years (25.0 ttl pk-yrs)    Types: Cigarettes    Start date: 66    Quit date: 2008    Years since quitting: 17.9   Smokeless tobacco: Never  Vaping Use   Vaping status: Never Used  Substance and Sexual Activity   Alcohol use: Yes    Comment: OCCASIONALLY. Beer once everyother week   Drug use: No   Sexual activity: Not on file   Tobacco Counseling Counseling given: Not Answered  SDOH Screenings   Food Insecurity: No Food Insecurity  (01/28/2024)  Housing: Low Risk  (01/28/2024)  Transportation Needs: No Transportation Needs (01/28/2024)  Utilities: Not At Risk (01/28/2024)  Alcohol Screen: Low Risk  (06/04/2023)  Depression (PHQ2-9): Low Risk  (01/28/2024)  Financial Resource Strain: Low Risk  (06/04/2023)  Physical Activity: Patient Declined (01/28/2024)  Social Connections: Moderately Isolated (01/28/2024)  Stress: No Stress Concern Present (01/28/2024)  Tobacco Use: Medium Risk (01/28/2024)  Health Literacy: Adequate Health Literacy (01/28/2024)   See flowsheets for full screening details  Depression Screen PHQ 2 & 9 Depression Scale- Over the past 2 weeks, how often have you been bothered by any of the following problems? Little interest or pleasure in doing things: 0 Feeling down, depressed, or hopeless (PHQ Adolescent also includes...irritable): 0 PHQ-2 Total Score: 0 Trouble falling or staying asleep, or sleeping too much: 0 Feeling tired or having little energy: 0 Poor appetite or overeating (PHQ Adolescent also includes...weight loss): 0 Feeling bad about yourself - or that you are a failure or have let yourself or your family down: 0 Trouble concentrating on things, such as reading the newspaper or watching television (PHQ Adolescent also includes...like school work): 0 Moving or speaking so slowly that other people could have noticed. Or the opposite - being so fidgety or restless that you have been moving around a lot more than usual: 0 Thoughts that you would be better off dead, or of hurting yourself in some way: 0 PHQ-9 Total Score: 0 If you checked off any problems, how difficult have these problems made it for you to do your work, take care of things at home, or get along with other people?: Not difficult at all  Depression Treatment Depression Interventions/Treatment : EYV7-0 Score <4 Follow-up Not Indicated     Goals Addressed             This Visit's Progress    Patient Stated   On track    I  would like to lose 25lb:cut back portions, increase water  intake.              Objective:    Today's Vitals   01/28/24 0827  BP: 110/78  Weight: (!) 385 lb (174.6 kg)  Height: 6' 3 (1.905 m)   Body mass index is 48.12 kg/m.  Hearing/Vision screen Hearing Screening - Comments:: No hearing aids some difficulties  Vision Screening - Comments:: Patient wears readers Immunizations and Health Maintenance Health Maintenance  Topic Date Due   Pneumococcal Vaccine: 50+ Years (1 of 2 - PCV) Never done   Zoster Vaccines- Shingrix (1 of 2) Never done   Influenza Vaccine  Never done   Medicare Annual Wellness (AWV)  01/15/2024   DTaP/Tdap/Td (2 - Td or Tdap) 12/20/2024   Colonoscopy  05/15/2026   Hepatitis C Screening  Completed   Meningococcal B Vaccine  Aged Out   COVID-19 Vaccine  Discontinued  Assessment/Plan:  This is a routine wellness examination for Tony Hampton.  Patient Care Team: Tony Tony HERO, NP as PCP - General (Pain Medicine) Darliss Rogue, MD as PCP - Cardiology (Cardiology) Dingeldein, Elspeth, MD (Ophthalmology)  I have personally reviewed and noted the following in the patient's chart:   Medical and social history Use of alcohol, tobacco or illicit drugs  Current medications and supplements including opioid prescriptions. Functional ability and status Nutritional status Physical activity Advanced directives List of other physicians Hospitalizations, surgeries, and ER visits in previous 12 months Vitals Screenings to include cognitive, depression, and falls Referrals and appointments  No orders of the defined types were placed in this encounter.  In addition, I have reviewed and discussed with patient certain preventive protocols, quality metrics, and best practice recommendations. A written personalized care plan for preventive services as well as general preventive health recommendations were provided to patient.   Lyle MARLA Right,  [place]   01/28/2024   No follow-ups on file.  After Visit Summary: (MyChart) Due to this being a telephonic visit, the after visit summary with patients personalized plan was offered to patient via MyChart   Nurse Notes: nothing to report

## 2024-01-29 ENCOUNTER — Encounter: Payer: Self-pay | Admitting: Family Medicine

## 2024-01-29 ENCOUNTER — Ambulatory Visit: Payer: Self-pay | Admitting: Family Medicine

## 2024-01-29 LAB — CBC WITH DIFFERENTIAL/PLATELET
Basophils Absolute: 0.1 K/uL (ref 0.0–0.1)
Basophils Relative: 0.8 % (ref 0.0–3.0)
Eosinophils Absolute: 0.2 K/uL (ref 0.0–0.7)
Eosinophils Relative: 3 % (ref 0.0–5.0)
HCT: 45.2 % (ref 39.0–52.0)
Hemoglobin: 15.3 g/dL (ref 13.0–17.0)
Lymphocytes Relative: 12.1 % (ref 12.0–46.0)
Lymphs Abs: 0.9 K/uL (ref 0.7–4.0)
MCHC: 33.9 g/dL (ref 30.0–36.0)
MCV: 93.3 fl (ref 78.0–100.0)
Monocytes Absolute: 0.9 K/uL (ref 0.1–1.0)
Monocytes Relative: 13 % — ABNORMAL HIGH (ref 3.0–12.0)
Neutro Abs: 5.1 K/uL (ref 1.4–7.7)
Neutrophils Relative %: 71.1 % (ref 43.0–77.0)
Platelets: 194 K/uL (ref 150.0–400.0)
RBC: 4.84 Mil/uL (ref 4.22–5.81)
RDW: 13.6 % (ref 11.5–15.5)
WBC: 7.1 K/uL (ref 4.0–10.5)

## 2024-01-29 LAB — COMPREHENSIVE METABOLIC PANEL WITH GFR
ALT: 15 U/L (ref 0–53)
AST: 14 U/L (ref 0–37)
Albumin: 4.3 g/dL (ref 3.5–5.2)
Alkaline Phosphatase: 50 U/L (ref 39–117)
BUN: 18 mg/dL (ref 6–23)
CO2: 25 meq/L (ref 19–32)
Calcium: 9.4 mg/dL (ref 8.4–10.5)
Chloride: 104 meq/L (ref 96–112)
Creatinine, Ser: 0.95 mg/dL (ref 0.40–1.50)
GFR: 83.07 mL/min (ref >60.00)
Glucose, Bld: 98 mg/dL (ref 70–99)
Potassium: 4.6 meq/L (ref 3.5–5.1)
Sodium: 141 meq/L (ref 135–145)
Total Bilirubin: 1.4 mg/dL — ABNORMAL HIGH (ref 0.2–1.2)
Total Protein: 7.2 g/dL (ref 6.0–8.3)

## 2024-01-29 LAB — BRAIN NATRIURETIC PEPTIDE: Pro B Natriuretic peptide (BNP): 249 pg/mL — ABNORMAL HIGH (ref 0.0–100.0)

## 2024-01-29 NOTE — Assessment & Plan Note (Signed)
 With concern for chronic CHF, d/w pt about path/phys.  Take lasix  today and then daily as needed.  Rationale for use discussed with patient. See notes on labs. Routine cautions given to patient.  He can let us  know if Lasix  isn't helping.  Discussed timing of Lasix  dose so it would be less bothersome overall.

## 2024-01-29 NOTE — Telephone Encounter (Signed)
 Copied from CRM [id number]. Topic: Clinical - Lab/Test Results >> Jan 29, 2024  3:47 PM Thersia BROCKS wrote: Reason for CRM: Patient called in regarding lab results, would like callback once they have been reviewed

## 2024-01-30 NOTE — Telephone Encounter (Signed)
 Copied from CRM #[id number]. Topic: General - Other >> Jan 30, 2024  1:01 PM Rosina BIRCH wrote: Reason for CRM: patient wife returning a call and I read the message to her and she stated the patient just started back on lasix  yesterday morning. Patient wife also stated that she heard the results and they will call the cardiology [phone]

## 2024-01-30 NOTE — Telephone Encounter (Signed)
 See Dr. Elfredia comments below. Can we update the patient. This was also sent to the patients Cardiologist    Please update patient. Labs are okay except for his BNP which appears to be chronically elevated and consistent with CHF. I want him to follow-up with cardiology. Please have him call for an appointment. I routed a copy of his labs to his PCP and cardiology. I appreciate the help of all involved. Let us  know if taking Lasix  is not helping.

## 2024-01-30 NOTE — Telephone Encounter (Addendum)
 Called patient wife on dpr reviewed all information and repeated back to me. Will call if any questions. They will call for appointment with cardiology

## 2024-02-02 ENCOUNTER — Other Ambulatory Visit: Payer: Self-pay | Admitting: Cardiology

## 2024-02-02 NOTE — Telephone Encounter (Signed)
 Noted. Thanks.

## 2024-02-03 ENCOUNTER — Other Ambulatory Visit (HOSPITAL_COMMUNITY): Payer: Self-pay

## 2024-02-03 ENCOUNTER — Other Ambulatory Visit: Payer: Self-pay

## 2024-02-03 MED ORDER — RIVAROXABAN 20 MG PO TABS
20.0000 mg | ORAL_TABLET | Freq: Every day | ORAL | 1 refills | Status: AC
  Filled 2024-02-03: qty 90, 90d supply, fill #0

## 2024-02-03 NOTE — Telephone Encounter (Signed)
 Prescription refill request for Xarelto  received.  Indication: AF Last office visit: 03/27/23  B Agbor-Etang MD Weight: 177.1kg Age: 68 Scr: 0.95 on 01/28/24  Epic CrCl: 189.01  Based on above findings Xarelto  20mg  daily is the appropriate dose.  Refill approved.

## 2024-02-04 ENCOUNTER — Ambulatory Visit: Admitting: Physician Assistant

## 2024-02-06 ENCOUNTER — Ambulatory Visit: Admitting: Physician Assistant

## 2024-02-07 ENCOUNTER — Other Ambulatory Visit: Payer: Self-pay | Admitting: Nurse Practitioner

## 2024-02-07 DIAGNOSIS — R351 Nocturia: Secondary | ICD-10-CM

## 2024-03-20 ENCOUNTER — Encounter: Payer: Self-pay | Admitting: Cardiology

## 2024-03-20 ENCOUNTER — Ambulatory Visit: Attending: Cardiology | Admitting: Cardiology

## 2024-03-20 VITALS — BP 110/82 | HR 75 | Ht 75.0 in | Wt 396.6 lb

## 2024-03-20 DIAGNOSIS — I4821 Permanent atrial fibrillation: Secondary | ICD-10-CM | POA: Diagnosis not present

## 2024-03-20 DIAGNOSIS — I77810 Thoracic aortic ectasia: Secondary | ICD-10-CM | POA: Diagnosis not present

## 2024-03-20 DIAGNOSIS — G4733 Obstructive sleep apnea (adult) (pediatric): Secondary | ICD-10-CM

## 2024-03-20 DIAGNOSIS — I10 Essential (primary) hypertension: Secondary | ICD-10-CM | POA: Diagnosis not present

## 2024-03-20 NOTE — Patient Instructions (Signed)

## 2024-03-20 NOTE — Progress Notes (Signed)
 " Cardiology Office Note:    Date:  03/20/2024   ID:  Tony Hampton, DOB [birth year], MRN [id number]  PCP:  Wendee Lynwood HERO, NP   [facility] Providers Cardiologist:  Redell Cave, MD     Referring MD: Wendee Lynwood HERO, NP   Chief Complaint  Patient presents with   Follow-up    12 months follow up visit. Patient is having some shortness of breath and swelling in his legs. Meds reviewed.     History of Present Illness:    Tony Hampton is a 69 y.o. 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  Denies palpitations, endorses shortness of breath with exertion.  Deviously had edema but not taking Lasix  as prescribed.  Edema adequately controlled when patient takes Lasix  daily. has not heard CPAP machine titrated or seen a sleep specialist for some time now.  Prior notes/testing Echo 05/2022 EF 55 to 60%, mild aortic root dilatation 43 mm.  Ascending aorta 40 mm. Echocardiogram outpatient at [facility] 11/2019 EF 55%  Past Medical History:  Diagnosis Date   Aneurysm    aortic   Atrial fibrillation (HCC)    Gout    Hypertension    OSA (obstructive sleep apnea)     Past Surgical History:  Procedure Laterality Date   COLONOSCOPY WITH PROPOFOL  N/A 07/31/2021   Procedure: COLONOSCOPY WITH PROPOFOL ;  Surgeon: Unk Corinn Skiff, MD;  Location: [facility] ENDOSCOPY;  Service: Gastroenterology;  Laterality: N/A;   COLONOSCOPY WITH PROPOFOL  N/A 05/15/2023   Procedure: COLONOSCOPY WITH PROPOFOL ;  Surgeon: Unk Corinn Skiff, MD;  Location: [facility] ENDOSCOPY;  Service: Gastroenterology;  Laterality: N/A;   KNEE ARTHROSCOPY Right    POLYPECTOMY  05/15/2023   Procedure: POLYPECTOMY;  Surgeon: Unk Corinn Skiff, MD;  Location: [facility] ENDOSCOPY;  Service: Gastroenterology;;   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 Hooten   TONSILLECTOMY      Current Medications: Current  Meds  Medication Sig   albuterol  (VENTOLIN  HFA) 108 (90 Base) MCG/ACT inhaler TAKE 2 PUFFS BY MOUTH EVERY 6 HOURS AS NEEDED FOR WHEEZE OR SHORTNESS OF BREATH   allopurinol  (ZYLOPRIM ) 100 MG tablet Take 1 tablet (100 mg total) by mouth daily.   amLODipine  (NORVASC ) 10 MG tablet Take 1 tablet (10 mg total) by mouth daily.   colchicine  0.6 MG tablet TAKE TWO TABLETS BY MOUTH AT ONSET OF GOUT FOLLOWED BY 1 TABLET IN 2 HOURS AS NEEDED.   furosemide  (LASIX ) 20 MG tablet Take 1 tablet (20 mg total) by mouth daily as needed.   metoprolol  succinate (TOPROL -XL) 50 MG 24 hr tablet TAKE 1 AND 1/2 TABLETS DAILY BY MOUTH   rivaroxaban  (XARELTO ) 20 MG TABS tablet Take 1 tablet (20 mg total) by mouth daily.   tamsulosin  (FLOMAX ) 0.4 MG CAPS capsule TAKE 1 CAPSULE BY MOUTH EVERY DAY     Allergies:   Patient has no known allergies.   Social History   Socioeconomic History   Marital status: Married    Spouse name: Not on file   Number of children: Not on file   Years of education: Not on file   Highest education level: 12th grade  Occupational History   Not on file  Tobacco Use   Smoking status: Former    Current packs/day: 0.00    Average packs/day: 1 pack/day for 25.0 years (25.0 ttl pk-yrs)    Types: Cigarettes  Start date: 4    Quit date: 2008    Years since quitting: 18.0   Smokeless tobacco: Never  Vaping Use   Vaping status: Never Used  Substance and Sexual Activity   Alcohol use: Yes    Comment: OCCASIONALLY. Beer once everyother week   Drug use: No   Sexual activity: Not on file  Other Topics Concern   Not on file  Social History Narrative   Not on file   Social Drivers of Health   Tobacco Use: Medium Risk (03/20/2024)   Patient History    Smoking Tobacco Use: Former    Smokeless Tobacco Use: Never    Passive Exposure: Not on file  Financial Resource Strain: Low Risk (06/04/2023)   Overall Financial Resource Strain (CARDIA)    Difficulty of Paying Living Expenses: Not  hard at all  Food Insecurity: No Food Insecurity (01/28/2024)   Epic    Worried About Radiation Protection Practitioner of Food in the Last Year: Never true    Ran Out of Food in the Last Year: Never true  Transportation Needs: No Transportation Needs (01/28/2024)   Epic    Lack of Transportation (Medical): No    Lack of Transportation (Non-Medical): No  Physical Activity: Patient Declined (01/28/2024)   Exercise Vital Sign    Days of Exercise per Week: Patient declined    Minutes of Exercise per Session: Patient declined  Stress: No Stress Concern Present (01/28/2024)   [employer] of Occupational Health - Occupational Stress Questionnaire    Feeling of Stress: Not at all  Social Connections: Moderately Isolated (01/28/2024)   Social Connection and Isolation Panel    Frequency of Communication with Friends and Family: More than three times a week    Frequency of Social Gatherings with Friends and Family: Twice a week    Attends Religious Services: Never    Database Administrator or Organizations: No    Attends Banker Meetings: Never    Marital Status: Married  Depression (PHQ2-9): Low Risk (01/28/2024)   Depression (PHQ2-9)    PHQ-2 Score: 1  Alcohol Screen: Low Risk (06/04/2023)   Alcohol Screen    Last Alcohol Screening Score (AUDIT): 2  Housing: Low Risk (01/28/2024)   Epic    Unable to Pay for Housing in the Last Year: No    Number of Times Moved in the Last Year: 0    Homeless in the Last Year: No  Utilities: Not At Risk (01/28/2024)   Epic    Threatened with loss of utilities: No  Health Literacy: Adequate Health Literacy (01/28/2024)   B1300 Health Literacy    Frequency of need for help with medical instructions: Never     Family History: The patient's family history includes Asthma in his mother; Breast cancer in his maternal grandmother; Breast cancer (age of onset: 29) in his sister; Diabetes in his father; Gallstones in his father; Hypertension in his father and mother;  Other in his mother.  ROS:   Please see the history of present illness.     All other systems reviewed and are negative.  EKGs/Labs/Other Studies Reviewed:    The following studies were reviewed today:   EKG Interpretation Date/Time:  Friday March 20 2024 11:48:15 EST Ventricular Rate:  75 PR Interval:    QRS Duration:  134 QT Interval:  422 QTC Calculation: 471 R Axis:   100  Text Interpretation: Atrial fibrillation with premature ventricular or aberrantly conducted complexes Right bundle branch block T wave  abnormality, consider inferior ischemia Confirmed by Darliss Rogue ([id number]) on 03/20/2024 12:13:46 PM    Recent Labs: 01/28/2024: ALT 15; BUN 18; Creatinine, Ser 0.95; Hemoglobin 15.3; Platelets 194.0; Potassium 4.6; Pro B Natriuretic peptide (BNP) 249.0; Sodium 141  Recent Lipid Panel    Component Value Date/Time   CHOL 141 01/15/2023 1113   TRIG 107.0 01/15/2023 1113   HDL 33.00 (L) 01/15/2023 1113   CHOLHDL 4 01/15/2023 1113   VLDL 21.4 01/15/2023 1113   LDLCALC 86 01/15/2023 1113   LDLCALC 71 06/13/2021 0931     Risk Assessment/Calculations:             Physical Exam:    VS:  BP 110/82 (BP Location: Left Arm, Patient Position: Sitting, Cuff Size: Large)   Pulse 75   Ht 6' 3 (1.905 m)   Wt (!) 396 lb 9.6 oz (179.9 kg)   SpO2 96%   BMI 49.57 kg/m     Wt Readings from Last 3 Encounters:  03/20/24 (!) 396 lb 9.6 oz (179.9 kg)  01/28/24 (!) 400 lb (181.4 kg)  01/28/24 (!) 385 lb (174.6 kg)     GEN:  Well nourished, well developed in no acute distress HEENT: Normal NECK: No JVD; No carotid bruits CARDIAC: Irregular irregular RESPIRATORY:  Clear to auscultation without rales, wheezing or rhonchi  ABDOMEN: Soft, non-tender, distended MUSCULOSKELETAL:  no edema; No deformity  SKIN: Warm and dry NEUROLOGIC:  Alert and oriented x 3 PSYCHIATRIC:  Normal affect   ASSESSMENT:    1. Permanent atrial fibrillation (HCC)   2. Primary hypertension    3. Ascending aorta dilatation   4. Morbid obesity (HCC)   5. OSA on CPAP    PLAN:    In order of problems listed above:  Permanent A-fib, denies palpitations.  Continue Toprol -XL 75 mg daily, Xarelto  20 mg daily.  Echo 05/2022 normal EF 55 to 60%, severely dilated atria. Hypertension, BP controlled.  Continue amlodipine  10 mg daily, continue Toprol -XL 75 mg daily. Mild ascending aorta and aortic root dilatation.  Repeat echo 5/25 ascending aorta 4.2 cm, no significant change from prior.  Continue serial monitoring, plan repeat echo in 2 years.   Morbid obesity, low-calorie diet, weight loss advised.  Referred to weight loss clinic. OSA, referred to sleep specialist for machine adjustments if needed.  Follow-up in 12 months.      Medication Adjustments/Labs and Tests Ordered: Current medicines are reviewed at length with the patient today.  Concerns regarding medicines are outlined above.  Orders Placed This Encounter  Procedures   Amb Ref to Medical Weight Management   Ambulatory referral to Pulmonology   EKG 12-Lead   No orders of the defined types were placed in this encounter.   Patient Instructions  Medication Instructions:  Your physician recommends that you continue on your current medications as directed. Please refer to the Current Medication list given to you today.   *If you need a refill on your cardiac medications before your next appointment, please call your pharmacy*  Lab Work: No labs ordered today  If you have labs (blood work) drawn today and your tests are completely normal, you will receive your results only by: MyChart Message (if you have MyChart) OR A paper copy in the mail If you have any lab test that is abnormal or we need to change your treatment, we will call you to review the results.  Testing/Procedures: No test ordered today   Follow-Up: At [facility], you and your  health needs are our priority.  As part of our continuing mission  to provide you with exceptional heart care, our providers are all part of one team.  This team includes your primary Cardiologist (physician) and Advanced Practice Providers or APPs (Physician Assistants and Nurse Practitioners) who all work together to provide you with the care you need, when you need it.  Your next appointment:   1 year(s)  Provider:   You may see Redell Cave, MD or one of the following Advanced Practice Providers on your designated Care Team:   Lonni Meager, NP Lesley Maffucci, PA-C Bernardino Bring, PA-C Cadence [place], PA-C Tylene Lunch, NP Barnie Hila, NP    We recommend signing up for the patient portal called MyChart.  Sign up information is provided on this After Visit Summary.  MyChart is used to connect with patients for Virtual Visits (Telemedicine).  Patients are able to view lab/test results, encounter notes, upcoming appointments, etc.  Non-urgent messages can be sent to your provider as well.   To learn more about what you can do with MyChart, go to [email or web address].              Signed, Redell Cave, MD  03/20/2024 12:42 PM    [facility] "

## 2024-03-26 ENCOUNTER — Ambulatory Visit: Admitting: Sleep Medicine

## 2024-03-26 ENCOUNTER — Other Ambulatory Visit: Payer: Self-pay | Admitting: Sleep Medicine

## 2024-03-26 ENCOUNTER — Encounter: Payer: Self-pay | Admitting: Sleep Medicine

## 2024-03-26 VITALS — BP 124/86 | HR 74 | Temp 98.7°F | Ht 75.0 in | Wt 399.0 lb

## 2024-03-26 DIAGNOSIS — G4739 Other sleep apnea: Secondary | ICD-10-CM

## 2024-03-26 DIAGNOSIS — G4733 Obstructive sleep apnea (adult) (pediatric): Secondary | ICD-10-CM

## 2024-03-26 DIAGNOSIS — Z6841 Body Mass Index (BMI) 40.0 and over, adult: Secondary | ICD-10-CM

## 2024-03-26 DIAGNOSIS — Z87891 Personal history of nicotine dependence: Secondary | ICD-10-CM

## 2024-03-26 DIAGNOSIS — I4891 Unspecified atrial fibrillation: Secondary | ICD-10-CM

## 2024-03-26 MED ORDER — ZEPBOUND 2.5 MG/0.5ML ~~LOC~~ SOAJ: 2.5000 mg | SUBCUTANEOUS | 1 refills | Status: AC

## 2024-03-26 NOTE — Progress Notes (Signed)
 "      Name:Tony Hampton MRN: [id number] DOB: 03/27/1955   CHIEF COMPLAINT:  ESTABLISH CARE FOR OSA   HISTORY OF PRESENT ILLNESS: Mr. Tony Hampton is a 69 y.o. w/ a h/o OSA, HTN, atrial fibrillation and morbid obesity who presents to establish care for OSA. Reports that he was initially diagnosed with OSA a few years ago and was subsequently started on CPAP therapy. Reports using CPAP therapy every night, which is confirmed by compliance data. He is currently using the Airtouch F20 FFM, which is comfortable. Reports intermittent air leaks. Reports feeling more refreshed upon awakening with CPAP therapy.   Denies any nocturnal awakenings. Reports significant weight changes. Admits to dry mouth. Denies morning headaches, RLS symptoms, dream enactment, cataplexy, hypnagogic or hypnapompic hallucinations. Denies a family history of sleep apnea. Denies drowsy driving. Drinks 1 cup of coffee almost daily, occasional alcohol use, denies tobacco or illicit drug use.   Bedtime 9-9:30 pm Sleep onset 5 mins Rise time 7 am   EPWORTH SLEEP SCORE 2    03/26/2024    1:54 PM  Results of the Epworth flowsheet  Sitting and reading 0  Watching TV 1  Sitting, inactive in a public place (e.g. a theatre or a meeting) 0  As a passenger in a car for an hour without a break 0  Lying down to rest in the afternoon when circumstances permit 1  Sitting and talking to someone 0  Sitting quietly after a lunch without alcohol 0  In a car, while stopped for a few minutes in traffic 0  Total score 2    PAST MEDICAL HISTORY :   has a past medical history of Aneurysm, Atrial fibrillation (HCC), Gout, Hypertension, and OSA (obstructive sleep apnea).  has a past surgical history that includes Tonsillectomy; Knee arthroscopy (Right); Replacement total knee (Right, Feb. 2016); Colonoscopy with propofol  (N/A, 07/31/2021); Colonoscopy with propofol  (N/A, 05/15/2023); and polypectomy (05/15/2023). Prior to Admission medications   Medication Sig Start Date End Date Taking? Authorizing Provider  albuterol  (VENTOLIN  HFA) 108 (90 Base) MCG/ACT inhaler TAKE 2 PUFFS BY MOUTH EVERY 6 HOURS AS NEEDED FOR WHEEZE OR SHORTNESS OF BREATH 08/05/23  Yes Wendee Lynwood HERO, NP  allopurinol  (ZYLOPRIM ) 100 MG tablet Take 1 tablet (100 mg total) by mouth daily. 01/28/24  Yes Cleatus Arlyss RAMAN, MD  amLODipine  (NORVASC ) 10 MG tablet Take 1 tablet (10 mg total) by mouth daily. 06/04/23  Yes Agbor-Etang, Redell, MD  colchicine  0.6 MG tablet TAKE TWO TABLETS BY MOUTH AT ONSET OF GOUT FOLLOWED BY 1 TABLET IN 2 HOURS AS NEEDED. 12/05/23  Yes Wendee Lynwood HERO, NP  furosemide  (LASIX ) 20 MG tablet Take 1 tablet (20 mg total) by mouth daily as needed. 06/04/23  Yes Agbor-Etang, Redell, MD  metoprolol  succinate (TOPROL -XL) 50 MG 24 hr tablet TAKE 1 AND 1/2 TABLETS DAILY BY MOUTH 08/06/23  Yes Agbor-Etang, Redell, MD  rivaroxaban  (XARELTO ) 20 MG TABS tablet Take 1 tablet (20 mg total) by mouth daily. 02/03/24  Yes Agbor-Etang, Redell, MD  tamsulosin  (FLOMAX ) 0.4 MG CAPS capsule TAKE 1 CAPSULE BY MOUTH EVERY DAY 02/07/24  Yes Wendee Lynwood HERO, NP   Allergies[1]  FAMILY HISTORY:  family history includes Asthma in his mother; Breast cancer in his maternal grandmother; Breast cancer (age of onset: 83) in his sister; Diabetes in his father; Gallstones in his father; Hypertension in his father and mother; Other in his mother. SOCIAL HISTORY:  reports that he quit smoking about 18 years ago. His  smoking use included cigarettes. He started smoking about 43 years ago. He has a 25 pack-year smoking history. He has never used smokeless tobacco. He reports current alcohol use. He reports that he does not use drugs.   Review of Systems:  Gen:  Denies  fever, sweats, chills weight loss  HEENT: Denies blurred vision, double vision, ear pain, eye pain, hearing loss, nose bleeds, sore throat Cardiac:  No dizziness, chest pain or heaviness, chest tightness,edema, No JVD Resp:   No cough,  -sputum production, -shortness of breath,-wheezing, -hemoptysis,  Gi: Denies swallowing difficulty, stomach pain, nausea or vomiting, diarrhea, constipation, bowel incontinence Gu:  Denies bladder incontinence, burning urine Ext:   Denies Joint pain, stiffness or swelling Skin: Denies  skin rash, easy bruising or bleeding or hives Endoc:  Denies polyuria, polydipsia , polyphagia or weight change Psych:   Denies depression, insomnia or hallucinations  Other:  All other systems negative  VITAL SIGNS: BP 124/86   Pulse 74   Temp 98.7 F (37.1 C)   Ht 6' 3 (1.905 m)   Wt (!) 399 lb (181 kg)   SpO2 94%   BMI 49.87 kg/m    Physical Examination:   General Appearance: No distress  EYES PERRLA, EOM intact.   NECK Supple, No JVD Pulmonary: normal breath sounds, No wheezing.  CardiovascularNormal S1,S2.  No m/r/g.   Abdomen: Benign, Soft, non-tender. Skin:   warm, no rashes, no ecchymosis  Extremities: normal, no cyanosis, clubbing. Neuro:without focal findings,  speech normal  PSYCHIATRIC: Mood, affect within normal limits.   ASSESSMENT AND PLAN  OSA Patient is using and benefiting from CPAP therapy. Due to elevated AHI on compliance data, will narrow pressure range and also complete in lab CPAP>ASV titration study. Discussed the consequences of untreated sleep apnea. Advised not to drive drowsy for safety of patient and others. Will complete further evaluation with a titration study and follow up to review results.    Atrial fibrillation Stable, on current management. Following with cardiology.   Morbid obesity Counseled patient on diet and lifestyle modification. Will try patient on Zepbound  and titrate as tolerated. Denies any family or personal history of thyroid  medullary CA or pancreatitis.    MEDICATION ADJUSTMENTS/LABS AND TESTS ORDERED: Recommend Sleep Study   Patient  satisfied with Plan of action and management. All questions answered  Follow up to review sleep  study results and treatment plan.   I spent a total of 30 minutes reviewing chart data, face-to-face evaluation with the patient, counseling and coordination of care as detailed above.    Devion Chriscoe, M.D.  [facility]           [1] No Known Allergies  "

## 2024-03-26 NOTE — Patient Instructions (Addendum)
" °  Patient Instructions Need to be using your CPAP every night, minimum of 7-8 hours a night.  Change equipment as directed. Wash your tubing with warm soap and water weekly, hang to dry. Wash humidifier portion weekly. Use distilled water and change daily Be aware of reduced alertness and do not drive or operate heavy machinery if experiencing this or drowsiness.  Exercise encouraged, as tolerated. Healthy weight management discussed.  Avoid or decrease alcohol consumption and medications that make you more sleepy, if possible. Notify if persistent daytime sleepiness occurs even with consistent use of PAP therapy.   Change CPAP supplies... Every month Mask cushions and/or nasal pillows CPAP machine filters Every 3 months Mask frame (not including the headgear) CPAP tubing Every 6 months Mask headgear Chin strap (if applicable) Humidifier water tub   Be aware of reduced alertness and do not drive or operate heavy machinery if experiencing this or drowsiness.  Exercise encouraged, as tolerated. Encouraged proper weight management.  Important to get eight or more hours of sleep  Limiting the use of the computer and television before bedtime.  Decrease naps during the day, so night time sleep will become enhanced.  Limit caffeine, and sleep deprivation.  HTN, stroke, uncontrolled diabetes and heart failure are potential risk factors.  Risk of untreated sleep apnea including cardiac arrhthymias, stroke, DM, pulm HTN.          "

## 2024-03-27 ENCOUNTER — Other Ambulatory Visit (HOSPITAL_COMMUNITY): Payer: Self-pay

## 2024-03-27 ENCOUNTER — Telehealth: Payer: Self-pay

## 2024-03-27 NOTE — Telephone Encounter (Signed)
 Pharmacy Patient Advocate Encounter   Received notification from RX Request Messages that prior authorization for Zepbound  2.5mg /0.22ml is required/requested.   Insurance verification completed.   The patient is insured through [employer].   Per test claim: PA required; PA submitted to above mentioned insurance via Latent Key/confirmation #/EOC [id number] Status is pending

## 2024-03-31 NOTE — Telephone Encounter (Signed)
 Noted, messaged pt, NFN.

## 2024-04-01 ENCOUNTER — Encounter: Payer: Self-pay | Admitting: Nurse Practitioner

## 2024-04-01 ENCOUNTER — Encounter (INDEPENDENT_AMBULATORY_CARE_PROVIDER_SITE_OTHER): Payer: Self-pay
